# Patient Record
Sex: Female | Born: 1995 | Race: White | Hispanic: Yes | Marital: Married | Smoking: Never smoker
Health system: Southern US, Community
[De-identification: ages and names within clinical notes are randomized; demographics above are authoritative.]

## PROBLEM LIST (undated history)

## (undated) DIAGNOSIS — K5900 Constipation, unspecified: Secondary | ICD-10-CM

## (undated) DIAGNOSIS — O039 Complete or unspecified spontaneous abortion without complication: Secondary | ICD-10-CM

## (undated) DIAGNOSIS — N83209 Unspecified ovarian cyst, unspecified side: Secondary | ICD-10-CM

## (undated) DIAGNOSIS — Z789 Other specified health status: Secondary | ICD-10-CM

## (undated) HISTORY — PX: NO PAST SURGERIES: SHX2092

---
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
## 2013-01-10 ENCOUNTER — Emergency Department (HOSPITAL_COMMUNITY): Payer: Medicaid Other

## 2013-01-10 ENCOUNTER — Inpatient Hospital Stay (HOSPITAL_COMMUNITY)
Admission: EM | Admit: 2013-01-10 | Discharge: 2013-01-12 | DRG: 690 | Disposition: A | Discharge status: Home / Self Care | Payer: Medicaid Other | Attending: Family Medicine | Admitting: Family Medicine

## 2013-01-10 ENCOUNTER — Encounter (HOSPITAL_COMMUNITY): Payer: Self-pay | Admitting: *Deleted

## 2013-01-10 DIAGNOSIS — D72829 Elevated white blood cell count, unspecified: Secondary | ICD-10-CM

## 2013-01-10 DIAGNOSIS — I951 Orthostatic hypotension: Secondary | ICD-10-CM | POA: Diagnosis present

## 2013-01-10 DIAGNOSIS — N2889 Other specified disorders of kidney and ureter: Secondary | ICD-10-CM

## 2013-01-10 DIAGNOSIS — Z23 Encounter for immunization: Secondary | ICD-10-CM

## 2013-01-10 DIAGNOSIS — A498 Other bacterial infections of unspecified site: Secondary | ICD-10-CM | POA: Diagnosis present

## 2013-01-10 DIAGNOSIS — E876 Hypokalemia: Secondary | ICD-10-CM

## 2013-01-10 DIAGNOSIS — N12 Tubulo-interstitial nephritis, not specified as acute or chronic: Principal | ICD-10-CM | POA: Diagnosis present

## 2013-01-10 LAB — URINALYSIS, ROUTINE W REFLEX MICROSCOPIC
Bilirubin Urine: NEGATIVE
Glucose, UA: NEGATIVE mg/dL
Specific Gravity, Urine: 1.012 (ref 1.005–1.030)
Urobilinogen, UA: 0.2 mg/dL (ref 0.0–1.0)

## 2013-01-10 LAB — CBC WITH DIFFERENTIAL/PLATELET
Eosinophils Absolute: 0.1 10*3/uL (ref 0.0–0.7)
Eosinophils Relative: 0 % (ref 0–5)
HCT: 43.6 % (ref 36.0–46.0)
Hemoglobin: 15.3 g/dL — ABNORMAL HIGH (ref 12.0–15.0)
Lymphocytes Relative: 16 % (ref 12–46)
Lymphs Abs: 3.1 10*3/uL (ref 0.7–4.0)
MCH: 29.8 pg (ref 26.0–34.0)
MCV: 85 fL (ref 78.0–100.0)
Monocytes Absolute: 1.2 10*3/uL — ABNORMAL HIGH (ref 0.1–1.0)
Monocytes Relative: 6 % (ref 3–12)
RBC: 5.13 MIL/uL — ABNORMAL HIGH (ref 3.87–5.11)
WBC: 19.6 10*3/uL — ABNORMAL HIGH (ref 4.0–10.5)

## 2013-01-10 LAB — WET PREP, GENITAL
Clue Cells Wet Prep HPF POC: NONE SEEN
Trich, Wet Prep: NONE SEEN

## 2013-01-10 LAB — COMPREHENSIVE METABOLIC PANEL
ALT: 15 U/L (ref 0–35)
BUN: 7 mg/dL (ref 6–23)
CO2: 21 mEq/L (ref 19–32)
Calcium: 9.8 mg/dL (ref 8.4–10.5)
GFR calc Af Amer: >90 mL/min (ref >90)
GFR calc non Af Amer: >90 mL/min (ref >90)
Glucose, Bld: 106 mg/dL — ABNORMAL HIGH (ref 70–99)
Total Protein: 9 g/dL — ABNORMAL HIGH (ref 6.0–8.3)

## 2013-01-10 LAB — URINE MICROSCOPIC-ADD ON

## 2013-01-10 LAB — LIPASE, BLOOD: Lipase: 19 U/L (ref 11–59)

## 2013-01-10 LAB — POCT PREGNANCY, URINE: Preg Test, Ur: NEGATIVE

## 2013-01-10 MED ORDER — IOHEXOL 300 MG/ML  SOLN: 100.0000 mL | Freq: Once | INTRAMUSCULAR | Status: AC | PRN

## 2013-01-10 MED ORDER — ONDANSETRON HCL 4 MG/2ML IJ SOLN
4.0000 mg | Freq: Once | INTRAMUSCULAR | Status: AC
  Administered 2013-01-10: 4 mg via INTRAVENOUS
  Filled 2013-01-10: qty 2

## 2013-01-10 MED ORDER — MORPHINE SULFATE 4 MG/ML IJ SOLN
4.0000 mg | Freq: Once | INTRAMUSCULAR | Status: AC
  Administered 2013-01-10: 4 mg via INTRAVENOUS
  Filled 2013-01-10: qty 1

## 2013-01-10 MED ORDER — SODIUM CHLORIDE 0.9 % IV BOLUS (SEPSIS)
1000.0000 mL | Freq: Once | INTRAVENOUS | Status: AC
  Administered 2013-01-10: 1000 mL via INTRAVENOUS

## 2013-01-10 MED ORDER — IOHEXOL 300 MG/ML  SOLN
80.0000 mL | Freq: Once | INTRAMUSCULAR | Status: AC | PRN
  Administered 2013-01-10: 80 mL via INTRAVENOUS

## 2013-01-10 MED ORDER — DEXTROSE 5 % IV SOLN
1.0000 g | Freq: Once | INTRAVENOUS | Status: AC
  Administered 2013-01-10: 1 g via INTRAVENOUS
  Filled 2013-01-10: qty 10

## 2013-01-10 NOTE — ED Provider Notes (Signed)
History     CSN: [id number]  Arrival date & time 01/10/13  2043   First MD Initiated Contact with Patient 01/10/13 2109      Chief Complaint  Patient presents with  . Abdominal Pain    (Consider location/radiation/quality/duration/timing/severity/associated sxs/prior treatment) HPI Pt p/w lower abd pain x 3 days worsening today. Associated with chills, dysuria and R flank pain. No vaginal d/c or bleeding. No prev abd surgeries.  Past Medical History  Diagnosis Date  . No pertinent past medical history     Past Surgical History  Procedure Date  . No past surgeries     History reviewed. No pertinent family history.  History  Substance Use Topics  . Smoking status: Never Smoker   . Smokeless tobacco: Not on file  . Alcohol Use: No    OB History    Grav Para Term Preterm Abortions TAB SAB Ect Mult Living   2 1 1       1       Review of Systems  Constitutional: Positive for chills and fatigue. Negative for fever.  Respiratory: Negative for shortness of breath.   Cardiovascular: Negative for chest pain.  Gastrointestinal: Positive for abdominal pain. Negative for nausea, vomiting, diarrhea and constipation.  Genitourinary: Positive for dysuria, flank pain and pelvic pain. Negative for vaginal bleeding and vaginal discharge.  Musculoskeletal: Positive for back pain.  Skin: Negative for rash and wound.  Neurological: Positive for dizziness and light-headedness. Negative for weakness, numbness and headaches.  All other systems reviewed and are negative.    Allergies  Review of patient's allergies indicates no known allergies.  Home Medications  No current outpatient prescriptions on file.  BP 108/58  Pulse 90  Temp 98.4 F (36.9 C) (Oral)  Resp 18  Ht 4\' 11"  (1.499 m)  Wt 123 lb (55.792 kg)  BMI 24.84 kg/m2  SpO2 99%  LMP 12/09/2012  Breastfeeding? Unknown  Physical Exam  Nursing note and vitals reviewed. Constitutional: She is oriented to person,  place, and time. She appears well-developed and well-nourished. She appears distressed.       Pt appears uncomfortable  HENT:  Head: Normocephalic and atraumatic.  Mouth/Throat: Oropharynx is clear and moist.  Eyes: EOM are normal. Pupils are equal, round, and reactive to light.  Neck: Normal range of motion. Neck supple.  Cardiovascular: Normal rate and regular rhythm.   Pulmonary/Chest: Effort normal and breath sounds normal. No respiratory distress. She has no wheezes. She has no rales.  Abdominal: Soft. Bowel sounds are normal. She exhibits no distension and no mass. There is tenderness (TTP diffuse lower abd. No rebound or guarding). There is no rebound and no guarding.  Genitourinary: No vaginal discharge found.       +suprapubic TTP, No def CMT   Musculoskeletal: Normal range of motion. She exhibits no edema and no tenderness.       R CVAT.   Neurological: She is alert and oriented to person, place, and time.  Skin: Skin is warm and dry. No rash noted. No erythema.  Psychiatric: She has a normal mood and affect. Her behavior is normal.    ED Course  Procedures (including critical care time)  Labs Reviewed  URINALYSIS, ROUTINE W REFLEX MICROSCOPIC - Abnormal; Notable for the following:    APPearance CLOUDY (*)     Hgb urine dipstick MODERATE (*)     Protein, ur 30 (*)     Nitrite POSITIVE (*)     Leukocytes, UA MODERATE (*)  All other components within normal limits  CBC WITH DIFFERENTIAL - Abnormal; Notable for the following:    WBC 19.6 (*)     RBC 5.13 (*)     Hemoglobin 15.3 (*)     Platelets 402 (*)     Neutrophils Relative 78 (*)     Neutro Abs 15.3 (*)     Monocytes Absolute 1.2 (*)     All other components within normal limits  COMPREHENSIVE METABOLIC PANEL - Abnormal; Notable for the following:    Potassium 3.3 (*)     Glucose, Bld 106 (*)     Total Protein 9.0 (*)     Total Bilirubin 0.2 (*)     All other components within normal limits  WET PREP,  GENITAL - Abnormal; Notable for the following:    WBC, Wet Prep HPF POC FEW (*)     All other components within normal limits  URINE MICROSCOPIC-ADD ON - Abnormal; Notable for the following:    Bacteria, UA MANY (*)     All other components within normal limits  BASIC METABOLIC PANEL - Abnormal; Notable for the following:    Glucose, Bld 144 (*)     BUN 5 (*)     Calcium 8.2 (*)     All other components within normal limits  CBC - Abnormal; Notable for the following:    WBC 14.8 (*)     Hemoglobin 11.7 (*)     HCT 34.8 (*)     All other components within normal limits  LIPASE, BLOOD  GC/CHLAMYDIA PROBE AMP  POCT PREGNANCY, URINE  LACTIC ACID, PLASMA  HIV ANTIBODY (ROUTINE TESTING)  URINE CULTURE  CULTURE, BLOOD (ROUTINE X 2)  CULTURE, BLOOD (ROUTINE X 2)   Ct Abdomen Pelvis W Contrast  01/10/2013  *RADIOLOGY REPORT*  Clinical Data: Right lower quadrant and right flank pain. Difficulty urinating, with dysuria.  CT ABDOMEN AND PELVIS WITH CONTRAST  Technique:  Multidetector CT imaging of the abdomen and pelvis was performed following the standard protocol during bolus administration of intravenous contrast.  Contrast: 80mL OMNIPAQUE IOHEXOL 300 MG/ML  SOLN  Comparison: Prior ultrasound of pregnancy performed 09/07/2011  Findings: The visualized lung bases are clear.  The liver and spleen are unremarkable in appearance.  The gallbladder is within normal limits.  The pancreas and adrenal glands are unremarkable.  There is unusual distension of the right renal pelvis with increased attenuation, and there is diffuse distension of the right ureter, with wall enhancement and increased intraluminal attenuation.  This likely reflects right-sided ureteritis, with complex fluid or possibly blood extending along the right ureter.  The right kidney remains grossly unremarkable.  There is no evidence of hydronephrosis.  The left kidney is unremarkable in appearance.  No renal or ureteral stones are  identified.  No free fluid is identified.  The small bowel is unremarkable in appearance.  The stomach is within normal limits.  No acute vascular abnormalities are seen.  The appendix is normal in caliber, without evidence for appendicitis.  The colon is unremarkable in appearance.  The bladder is mildly distended; there is slightly prominent bladder wall enhancement.  This could reflect cystitis.  The uterus is grossly unremarkable in appearance.  The ovaries are grossly symmetric, with a small right adnexal cyst, likely physiologic in nature.  No suspicious adnexal masses are seen.  No inguinal lymphadenopathy is seen.  No acute osseous abnormalities are identified.  IMPRESSION:  1.  Unusually increased attenuation noted within the  right renal pelvis, with diffuse distension of the right ureter, also demonstrating increased intraluminal attenuation and wall enhancement.  This likely reflects significant right-sided ureteritis, with complex fluid or possibly blood extending along the right ureter.  2.  No definite evidence of pyelonephritis on CT. 3.  Slightly prominent bladder wall enhancement could reflect cystitis.   Original Report Authenticated By: Tonia Ghent, M.D.      1. Ureteritis   2. Hypokalemia   3. Leukocytosis       MDM          Loren Racer, MD 01/11/13 [phone]

## 2013-01-10 NOTE — ED Notes (Signed)
PT to ER via private car for complaints of lower abdominal pain; pt minimal English speaking; reports painful and difficulty urinating; severe lower abd pain; denies vaginal bleeding or vaginal discharge.

## 2013-01-10 NOTE — ED Notes (Signed)
Pelvic cart to bedside 

## 2013-01-10 NOTE — ED Provider Notes (Signed)
Care assumed from Dr. Ranae Palms. Patient with lower abdominal pain. Reported pelvic exam unremarkable. Concern for pyelonephritis as appendicitis given location of pain. CT scan pending.  Results for orders placed during the hospital encounter of 01/10/13  URINALYSIS, ROUTINE W REFLEX MICROSCOPIC      Component Value Range   Color, Urine YELLOW  YELLOW   APPearance CLOUDY (*) CLEAR   Specific Gravity, Urine 1.012  1.005 - 1.030   pH 7.0  5.0 - 8.0   Glucose, UA NEGATIVE  NEGATIVE mg/dL   Hgb urine dipstick MODERATE (*) NEGATIVE   Bilirubin Urine NEGATIVE  NEGATIVE   Ketones, ur NEGATIVE  NEGATIVE mg/dL   Protein, ur 30 (*) NEGATIVE mg/dL   Urobilinogen, UA 0.2  0.0 - 1.0 mg/dL   Nitrite POSITIVE (*) NEGATIVE   Leukocytes, UA MODERATE (*) NEGATIVE  CBC WITH DIFFERENTIAL      Component Value Range   WBC 19.6 (*) 4.0 - 10.5 K/uL   RBC 5.13 (*) 3.87 - 5.11 MIL/uL   Hemoglobin 15.3 (*) 12.0 - 15.0 g/dL   HCT 16.1  09.6 - 04.5 %   MCV 85.0  78.0 - 100.0 fL   MCH 29.8  26.0 - 34.0 pg   MCHC 35.1  30.0 - 36.0 g/dL   RDW 40.9  81.1 - 91.4 %   Platelets 402 (*) 150 - 400 K/uL   Neutrophils Relative 78 (*) 43 - 77 %   Neutro Abs 15.3 (*) 1.7 - 7.7 K/uL   Lymphocytes Relative 16  12 - 46 %   Lymphs Abs 3.1  0.7 - 4.0 K/uL   Monocytes Relative 6  3 - 12 %   Monocytes Absolute 1.2 (*) 0.1 - 1.0 K/uL   Eosinophils Relative 0  0 - 5 %   Eosinophils Absolute 0.1  0.0 - 0.7 K/uL   Basophils Relative 0  0 - 1 %   Basophils Absolute 0.0  0.0 - 0.1 K/uL  COMPREHENSIVE METABOLIC PANEL      Component Value Range   Sodium 137  135 - 145 mEq/L   Potassium 3.3 (*) 3.5 - 5.1 mEq/L   Chloride 99  96 - 112 mEq/L   CO2 21  19 - 32 mEq/L   Glucose, Bld 106 (*) 70 - 99 mg/dL   BUN 7  6 - 23 mg/dL   Creatinine, Ser 7.82  0.50 - 1.10 mg/dL   Calcium 9.8  8.4 - 95.6 mg/dL   Total Protein 9.0 (*) 6.0 - 8.3 g/dL   Albumin 4.1  3.5 - 5.2 g/dL   AST 14  0 - 37 U/L   ALT 15  0 - 35 U/L   Alkaline  Phosphatase 108  39 - 117 U/L   Total Bilirubin 0.2 (*) 0.3 - 1.2 mg/dL   GFR calc non Af Amer >90  >90 mL/min   GFR calc Af Amer >90  >90 mL/min  LIPASE, BLOOD      Component Value Range   Lipase 19  11 - 59 U/L  WET PREP, GENITAL      Component Value Range   Yeast Wet Prep HPF POC NONE SEEN  NONE SEEN   Trich, Wet Prep NONE SEEN  NONE SEEN   Clue Cells Wet Prep HPF POC NONE SEEN  NONE SEEN   WBC, Wet Prep HPF POC FEW (*) NONE SEEN  POCT PREGNANCY, URINE      Component Value Range   Preg Test, Ur NEGATIVE  NEGATIVE  URINE MICROSCOPIC-ADD ON      Component Value Range   Squamous Epithelial / LPF RARE  RARE   WBC, UA 21-50  <3 WBC/hpf   RBC / HPF 3-6  <3 RBC/hpf   Bacteria, UA MANY (*) RARE   Ct Abdomen Pelvis W Contrast  01/10/2013  *RADIOLOGY REPORT*  Clinical Data: Right lower quadrant and right flank pain. Difficulty urinating, with dysuria.  CT ABDOMEN AND PELVIS WITH CONTRAST  Technique:  Multidetector CT imaging of the abdomen and pelvis was performed following the standard protocol during bolus administration of intravenous contrast.  Contrast: 80mL OMNIPAQUE IOHEXOL 300 MG/ML  SOLN  Comparison: Prior ultrasound of pregnancy performed 09/07/2011  Findings: The visualized lung bases are clear.  The liver and spleen are unremarkable in appearance.  The gallbladder is within normal limits.  The pancreas and adrenal glands are unremarkable.  There is unusual distension of the right renal pelvis with increased attenuation, and there is diffuse distension of the right ureter, with wall enhancement and increased intraluminal attenuation.  This likely reflects right-sided ureteritis, with complex fluid or possibly blood extending along the right ureter.  The right kidney remains grossly unremarkable.  There is no evidence of hydronephrosis.  The left kidney is unremarkable in appearance.  No renal or ureteral stones are identified.  No free fluid is identified.  The small bowel is unremarkable  in appearance.  The stomach is within normal limits.  No acute vascular abnormalities are seen.  The appendix is normal in caliber, without evidence for appendicitis.  The colon is unremarkable in appearance.  The bladder is mildly distended; there is slightly prominent bladder wall enhancement.  This could reflect cystitis.  The uterus is grossly unremarkable in appearance.  The ovaries are grossly symmetric, with a small right adnexal cyst, likely physiologic in nature.  No suspicious adnexal masses are seen.  No inguinal lymphadenopathy is seen.  No acute osseous abnormalities are identified.  IMPRESSION:  1.  Unusually increased attenuation noted within the right renal pelvis, with diffuse distension of the right ureter, also demonstrating increased intraluminal attenuation and wall enhancement.  This likely reflects significant right-sided ureteritis, with complex fluid or possibly blood extending along the right ureter.  2.  No definite evidence of pyelonephritis on CT. 3.  Slightly prominent bladder wall enhancement could reflect cystitis.   Original Report Authenticated By: Tonia Ghent, M.D.       Olivia Mackie, MD 01/11/13 [phone]

## 2013-01-10 NOTE — ED Notes (Signed)
Pt unable to ambulate to bathroom w/o assistance. Pt c/o severe pain to lower abd.

## 2013-01-11 ENCOUNTER — Encounter (HOSPITAL_COMMUNITY): Payer: Self-pay | Admitting: Internal Medicine

## 2013-01-11 DIAGNOSIS — D72829 Elevated white blood cell count, unspecified: Secondary | ICD-10-CM

## 2013-01-11 DIAGNOSIS — E876 Hypokalemia: Secondary | ICD-10-CM

## 2013-01-11 DIAGNOSIS — N2889 Other specified disorders of kidney and ureter: Secondary | ICD-10-CM

## 2013-01-11 LAB — BASIC METABOLIC PANEL
BUN: 5 mg/dL — ABNORMAL LOW (ref 6–23)
Chloride: 103 mEq/L (ref 96–112)
Creatinine, Ser: 0.58 mg/dL (ref 0.50–1.10)
GFR calc Af Amer: >90 mL/min (ref >90)
Glucose, Bld: 144 mg/dL — ABNORMAL HIGH (ref 70–99)

## 2013-01-11 LAB — CBC
HCT: 34.8 % — ABNORMAL LOW (ref 36.0–46.0)
Hemoglobin: 11.7 g/dL — ABNORMAL LOW (ref 12.0–15.0)
MCH: 29 pg (ref 26.0–34.0)
MCHC: 33.6 g/dL (ref 30.0–36.0)
MCV: 86.4 fL (ref 78.0–100.0)
RDW: 12.9 % (ref 11.5–15.5)

## 2013-01-11 LAB — LACTIC ACID, PLASMA: Lactic Acid, Venous: 1.8 mmol/L (ref 0.5–2.2)

## 2013-01-11 LAB — GC/CHLAMYDIA PROBE AMP: GC Probe RNA: NEGATIVE

## 2013-01-11 MED ORDER — ACETAMINOPHEN 650 MG RE SUPP: 650.0000 mg | Freq: Four times a day (QID) | RECTAL | Status: DC | PRN

## 2013-01-11 MED ORDER — SODIUM CHLORIDE 0.9 % IJ SOLN: 3.0000 mL | Freq: Two times a day (BID) | INTRAMUSCULAR | Status: DC

## 2013-01-11 MED ORDER — POTASSIUM CHLORIDE CRYS ER 20 MEQ PO TBCR
40.0000 meq | EXTENDED_RELEASE_TABLET | Freq: Once | ORAL | Status: AC
  Administered 2013-01-11: 40 meq via ORAL
  Filled 2013-01-11: qty 2

## 2013-01-11 MED ORDER — HYDROCODONE-ACETAMINOPHEN 5-325 MG PO TABS
1.0000 | ORAL_TABLET | ORAL | Status: DC | PRN
  Administered 2013-01-11 (×3): 1 via ORAL
  Administered 2013-01-12: 2 via ORAL
  Filled 2013-01-11 (×5): qty 1

## 2013-01-11 MED ORDER — ACETAMINOPHEN 325 MG PO TABS: 650.0000 mg | ORAL_TABLET | Freq: Four times a day (QID) | ORAL | Status: DC | PRN

## 2013-01-11 MED ORDER — INFLUENZA VIRUS VACC SPLIT PF IM SUSP
0.5000 mL | INTRAMUSCULAR | Status: DC
  Filled 2013-01-11 (×3): qty 0.5

## 2013-01-11 MED ORDER — ONDANSETRON HCL 4 MG PO TABS: 4.0000 mg | ORAL_TABLET | Freq: Four times a day (QID) | ORAL | Status: DC | PRN

## 2013-01-11 MED ORDER — ONDANSETRON HCL 4 MG/2ML IJ SOLN
4.0000 mg | Freq: Three times a day (TID) | INTRAMUSCULAR | Status: DC | PRN
  Filled 2013-01-11: qty 2

## 2013-01-11 MED ORDER — IOHEXOL 300 MG/ML  SOLN: 50.0000 mL | Freq: Once | INTRAMUSCULAR | Status: AC | PRN

## 2013-01-11 MED ORDER — HYDROMORPHONE HCL PF 1 MG/ML IJ SOLN
1.0000 mg | INTRAMUSCULAR | Status: DC | PRN
  Filled 2013-01-11: qty 1

## 2013-01-11 MED ORDER — HYDROMORPHONE HCL PF 1 MG/ML IJ SOLN
1.0000 mg | INTRAMUSCULAR | Status: DC | PRN
  Administered 2013-01-11: 1 mg via INTRAVENOUS

## 2013-01-11 MED ORDER — SODIUM CHLORIDE 0.9 % IV SOLN
INTRAVENOUS | Status: DC
  Administered 2013-01-11 – 2013-01-12 (×4): via INTRAVENOUS

## 2013-01-11 MED ORDER — ONDANSETRON HCL 4 MG/2ML IJ SOLN
4.0000 mg | Freq: Four times a day (QID) | INTRAMUSCULAR | Status: DC | PRN
  Administered 2013-01-11: 4 mg via INTRAVENOUS

## 2013-01-11 MED ORDER — DOCUSATE SODIUM 100 MG PO CAPS
100.0000 mg | ORAL_CAPSULE | Freq: Two times a day (BID) | ORAL | Status: DC
  Administered 2013-01-11 – 2013-01-12 (×4): 100 mg via ORAL
  Filled 2013-01-11 (×5): qty 1

## 2013-01-11 MED ORDER — CEFTRIAXONE SODIUM 1 G IJ SOLR
1.0000 g | INTRAMUSCULAR | Status: DC
  Administered 2013-01-12: 1 g via INTRAVENOUS
  Filled 2013-01-11: qty 10

## 2013-01-11 NOTE — H&P (Signed)
[facility] History and Physical  Kristen Weaver ZOX:[id number] DOB: 29-Oct-1996 DOA: 01/10/2013  Referring physician: Dr Norlene Campbell. PCP: No primary provider on file.    Chief Complaint: abdominal pain.   HPI: Kristen Weaver is a 17 y.o. female with no PMH presents to ED complaining of abdominal pain that started 4 days prior to admission. Patient relates suprapubic pain, dysuria, initially unable to urinate due to severe pain. Patient has been getting progressively worse. Today she was not even able to ambulate due to pain. She also relates dizziness on standing. She almost pass out. She relates chills, nausea. No vomiting. Denies hematuria.   Review of Systems: The patient denies anorexia, , weight loss,, vision loss, decreased hearing, hoarseness, chest pain,  dyspnea on exertion, peripheral edema, balance deficits, hemoptysis, melena, hematochezia,  hematuria,  genital sores, muscle weakness, suspicious skin lesions, transient blindness, difficulty walking, depression, unusual weight change, abnormal bleeding, enlarged lymph nodes, angioedema, and breast masses.    Past Medical History  Diagnosis Date  . No pertinent past medical history    Past Surgical History  Procedure Date  . No past surgeries    Social History:  reports that she has never smoked. She does not have any smokeless tobacco history on file. She reports that she does not drink alcohol or use illicit drugs.Live with husband, has 82 year old son. She is not working. Does not have a PCP.    No Known Allergies  Family History: Mother Healthy. Father healthy.   Prior to Admission medications   Not on File  She is not taking any medications.   Physical Exam: Filed Vitals:   01/11/13 0000 01/11/13 0008 01/11/13 0040 01/11/13 0117  BP:   100/47 106/58  Pulse: 113  101   Temp:  99.4 F (37.4 C)    TempSrc:  Oral    Resp: 22  18 18   SpO2: 100%  97%    General Appearance:    Alert,  cooperative, no distress, appears stated age  Head:    Normocephalic, without obvious abnormality, atraumatic  Eyes:    PERRL, conjunctiva/corneas clear, EOM's intact,      Ears:    Normal TM's and external ear canals, both ears  Nose:   Nares normal, septum midline, mucosa normal, no drainage    or sinus tenderness  Throat:   Lips, mucosa, and tongue normal; teeth and gums normal  Neck:   Supple, symmetrical, trachea midline, no adenopathy;    thyroid:  no enlargement/tenderness/nodules; no carotid   bruit or JVD  Back:     Symmetric, no curvature, ROM normal, no CVA tenderness  Lungs:     Clear to auscultation bilaterally, respirations unlabored      Heart:    Regular rate and rhythm, S1 and S2 normal, no murmur, rub   or gallop     Abdomen:     Soft, supra pubic tender, bowel sounds active all four quadrants,    no masses, no organomegaly        Extremities:   Extremities normal, atraumatic, no cyanosis or edema  Pulses:   2+ and symmetric all extremities  Skin:   Skin color, texture, turgor normal, no rashes or lesions  Lymph nodes:   Cervical, supraclavicular, and axillary nodes normal  Neurologic:   CNII-XII intact, normal strength, sensation and reflexes    throughout      Labs on Admission:  Basic Metabolic Panel:  Lab 01/10/13 8119  NA 137  K  3.3*  CL 99  CO2 21  GLUCOSE 106*  BUN 7  CREATININE 0.63  CALCIUM 9.8  MG --  PHOS --   Liver Function Tests:  Lab 01/10/13 2100  AST 14  ALT 15  ALKPHOS 108  BILITOT 0.2*  PROT 9.0*  ALBUMIN 4.1    Lab 01/10/13 2100  LIPASE 19  AMYLASE --   No results found for this basename: AMMONIA:5 in the last 168 hours CBC:  Lab 01/10/13 2100  WBC 19.6*  NEUTROABS 15.3*  HGB 15.3*  HCT 43.6  MCV 85.0  PLT 402*   Cardiac Enzymes: No results found for this basename: CKTOTAL:5,CKMB:5,CKMBINDEX:5,TROPONINI:5 in the last 168 hours  BNP (last 3 results) No results found for this basename: PROBNP:3 in the last  8760 hours CBG: No results found for this basename: GLUCAP:5 in the last 168 hours  Radiological Exams on Admission: Ct Abdomen Pelvis W Contrast  01/10/2013  *RADIOLOGY REPORT*  Clinical Data: Right lower quadrant and right flank pain. Difficulty urinating, with dysuria.  CT ABDOMEN AND PELVIS WITH CONTRAST  Technique:  Multidetector CT imaging of the abdomen and pelvis was performed following the standard protocol during bolus administration of intravenous contrast.  Contrast: 80mL OMNIPAQUE IOHEXOL 300 MG/ML  SOLN  Comparison: Prior ultrasound of pregnancy performed 09/07/2011  Findings: The visualized lung bases are clear.  The liver and spleen are unremarkable in appearance.  The gallbladder is within normal limits.  The pancreas and adrenal glands are unremarkable.  There is unusual distension of the right renal pelvis with increased attenuation, and there is diffuse distension of the right ureter, with wall enhancement and increased intraluminal attenuation.  This likely reflects right-sided ureteritis, with complex fluid or possibly blood extending along the right ureter.  The right kidney remains grossly unremarkable.  There is no evidence of hydronephrosis.  The left kidney is unremarkable in appearance.  No renal or ureteral stones are identified.  No free fluid is identified.  The small bowel is unremarkable in appearance.  The stomach is within normal limits.  No acute vascular abnormalities are seen.  The appendix is normal in caliber, without evidence for appendicitis.  The colon is unremarkable in appearance.  The bladder is mildly distended; there is slightly prominent bladder wall enhancement.  This could reflect cystitis.  The uterus is grossly unremarkable in appearance.  The ovaries are grossly symmetric, with a small right adnexal cyst, likely physiologic in nature.  No suspicious adnexal masses are seen.  No inguinal lymphadenopathy is seen.  No acute osseous abnormalities are  identified.  IMPRESSION:  1.  Unusually increased attenuation noted within the right renal pelvis, with diffuse distension of the right ureter, also demonstrating increased intraluminal attenuation and wall enhancement.  This likely reflects significant right-sided ureteritis, with complex fluid or possibly blood extending along the right ureter.  2.  No definite evidence of pyelonephritis on CT. 3.  Slightly prominent bladder wall enhancement could reflect cystitis.   Original Report Authenticated By: Tonia Ghent, M.D.       Assessment/Plan Active Problems:  Ureteritis  Leukocytosis  Hypokalemia   1-Ureteritis/Pyelonephritis: Patient will be admitted for treatment of pyelo, ureteritis. Will continue with IV antibiotic, ceftriaxone. She has significant leukocytosis. Lactic acid pending work up for sepsis. BP stable at this time. IV fluids. Will order blood culture. Urine culture and GC/chlamidia was ordered. Dr Norlene Campbell discussed CT scan scan result with urologist on call, who recommend to treat like pyelonephritis.  2-Leukocytosis: Likely secondary to  UTI. Blood culture. IV antibiotics.  3-Screen HIV.  4-Hypokalemia: replete with 40 meq times 1.  5-Dizziness: likely orthostatic. Continue with IV fluids. Received 2 L in the ED.   Code Status: Full Code.  Family Communication: Husband at bedside plan of care discussed with him.  Disposition Plan: expect 3 days depending on condition.   Time spent: 50 minutes.   Kristen Weaver [facility] Pager [phone]  If 7PM-7AM, please contact night-coverage [email or web address] Password [email or web address] 01/11/2013, 1:26 AM

## 2013-01-11 NOTE — Progress Notes (Signed)
Patient's current VTE is for SCDs. At this time, there are not any current SCD's available within the system. [facility] is aware that patient needs them when they become available.

## 2013-01-11 NOTE — ED Notes (Signed)
Hospitalist at bedside 

## 2013-01-11 NOTE — ED Notes (Signed)
Attempted to give report to [place], [place]. Currently busy.

## 2013-01-11 NOTE — ED Notes (Signed)
Phlebotomy at bedside.

## 2013-01-11 NOTE — Progress Notes (Signed)
Patient seen and evaluated earlier this AM by my associate.  Please refer to her H and P for details regarding her assessment and Plan.  Ureteritis - Urine culture pending - continue IV rocephin - monitor WBC levels and fever curve - Based on orders urology consulted as well - Wet prep negative for yeast, trich, or clue cells - GC/Chlamydia pending  Leukocytosis - likely 2ary to # 1 - trending down on IV antibiotics - continue to monitor - Blood cultures pending  Hypokalemia - resolved after oral replacement  Will f/u next am  Torrin Crihfield, Pamala Hurry

## 2013-01-12 LAB — URINE CULTURE

## 2013-01-12 LAB — CBC
HCT: 37.1 % (ref 36.0–46.0)
MCH: 28.7 pg (ref 26.0–34.0)
MCHC: 32.9 g/dL (ref 30.0–36.0)
MCV: 87.3 fL (ref 78.0–100.0)
Platelets: 323 10*3/uL (ref 150–400)
RDW: 13.2 % (ref 11.5–15.5)
WBC: 9.2 10*3/uL (ref 4.0–10.5)

## 2013-01-12 MED ORDER — CIPROFLOXACIN HCL 500 MG PO TABS: 500.0000 mg | ORAL_TABLET | Freq: Two times a day (BID) | ORAL | Status: DC

## 2013-01-12 MED ORDER — ONDANSETRON 4 MG PO TBDP: 4.0000 mg | ORAL_TABLET | Freq: Three times a day (TID) | ORAL | Status: DC | PRN

## 2013-01-12 MED ORDER — HYDROCODONE-ACETAMINOPHEN 5-325 MG PO TABS: 1.0000 | ORAL_TABLET | ORAL | Status: DC | PRN

## 2013-01-12 NOTE — Discharge Summary (Signed)
Physician Discharge Summary  Kristen Weaver:[id number] DOB: 03-Oct-1996 DOA: 01/10/2013  PCP: No primary provider on file.  Admit date: 01/10/2013 Discharge date: 01/12/2013  Time spent: > 35 minutes  Recommendations for Outpatient Follow-up:  1. Please be sure to follow up with your primary care physician.  Also when evaluating patient in follow up please decide whether or not patient will require extension of antibiotic regimen for her UTI/Uretitis.  2. I have recommended that patient follow up with her primary care physician for appropriate referrals for her reported gynecological problems as well as uretitis.  Discharge Diagnoses:  Active Problems:  Ureteritis  Leukocytosis  Hypokalemia   Discharge Condition: stable  Diet recommendation: Regular diet  Filed Weights   01/11/13 0351  Weight: 55.792 kg (123 lb)    History of present illness:  From original HPI: Kristen Weaver is a 17 y.o. female with no PMH presents to ED complaining of abdominal pain that started 4 days prior to admission. Patient relates suprapubic pain, dysuria, initially unable to urinate due to severe pain. Patient has been getting progressively worse. Today she was not even able to ambulate due to pain. She also relates dizziness on standing. She almost pass out. She relates chills, nausea. No vomiting. Denies hematuria.    Hospital Course:  Ureteritis  - Most likely due to E coli infection and would expect resolution with antibiotics.  Patient to follow up with her PCP for further evaluation and recommendations. - improved initially with IV rocephin  - WBC within normal limits on day of discharge. - Wet prep negative for yeast, trich, or clue cells  - GC/Chlamydia negative  Leukocytosis  - likely 2ary to # 1  - trended down on IV antibiotics.  Sensitivities back which grew out E coli which was sensitive to everything except bactrim - Discussed antibiotic options with case  manager and ciprofloxacin should only cost patient 4 dollars in [facility]. - within normal limits on day of discharge. - Blood cultures shows no growth to date.   Hypokalemia  - resolved after oral replacement  Procedures:  None  Consultations:  none  Discharge Exam: Filed Vitals:   01/11/13 0638 01/11/13 1457 01/11/13 2154 01/12/13 0527  BP: 94/53 108/58 104/58 108/56  Pulse: 86 90 81 79  Temp: 98 F (36.7 C) 98.4 F (36.9 C) 98.8 F (37.1 C) 98.1 F (36.7 C)  TempSrc: Oral Oral Oral Oral  Resp: 16 18 18 18   Height:      Weight:      SpO2: 99% 99% 100% 100%    General: Pt in NAD, Alert and Oriented x 3 Cardiovascular: RRR, No MRG Respiratory: CTA BL, no wheezes Abdomen: soft, nondistended, suprapubic discomfort  Discharge Instructions  Discharge Orders    Future Orders Please Complete By Expires   Diet - low sodium heart healthy      Increase activity slowly      Discharge instructions      Comments:   Me gustaria que terminaras todas las pastillas de el antibiotico recommendado.  Seria por 10 dias q te recomiendo que tomes ciprofloxacin.  Se toma dos veces al dia por los proximos 10 dias.  Tambien te recete medicamento para la nausea toma los como indicado.  Si tienes preguntas [employer] recetados por favor de preguntarle al pharmaceutico.  Please be sure to take your medication as indicated and finish all of the antibiotics prescribed.   Call MD for:  temperature >100.4  Call MD for:  severe uncontrolled pain      Call MD for:  persistant nausea and vomiting      Call MD for:  extreme fatigue          Medication List     As of 01/12/2013  2:22 PM    TAKE these medications         ciprofloxacin 500 MG tablet   Commonly known as: CIPRO   Take 1 tablet (500 mg total) by mouth 2 (two) times daily.      HYDROcodone-acetaminophen 5-325 MG per tablet   Commonly known as: NORCO/VICODIN   Take 1 tablet by mouth every 4 (four) hours as needed  for pain.      ondansetron 4 MG disintegrating tablet   Commonly known as: ZOFRAN-ODT   Take 1 tablet (4 mg total) by mouth every 8 (eight) hours as needed for nausea.          The results of significant diagnostics from this hospitalization (including imaging, microbiology, ancillary and laboratory) are listed below for reference.    Significant Diagnostic Studies: Ct Abdomen Pelvis W Contrast  01/10/2013  *RADIOLOGY REPORT*  Clinical Data: Right lower quadrant and right flank pain. Difficulty urinating, with dysuria.  CT ABDOMEN AND PELVIS WITH CONTRAST  Technique:  Multidetector CT imaging of the abdomen and pelvis was performed following the standard protocol during bolus administration of intravenous contrast.  Contrast: 80mL OMNIPAQUE IOHEXOL 300 MG/ML  SOLN  Comparison: Prior ultrasound of pregnancy performed 09/07/2011  Findings: The visualized lung bases are clear.  The liver and spleen are unremarkable in appearance.  The gallbladder is within normal limits.  The pancreas and adrenal glands are unremarkable.  There is unusual distension of the right renal pelvis with increased attenuation, and there is diffuse distension of the right ureter, with wall enhancement and increased intraluminal attenuation.  This likely reflects right-sided ureteritis, with complex fluid or possibly blood extending along the right ureter.  The right kidney remains grossly unremarkable.  There is no evidence of hydronephrosis.  The left kidney is unremarkable in appearance.  No renal or ureteral stones are identified.  No free fluid is identified.  The small bowel is unremarkable in appearance.  The stomach is within normal limits.  No acute vascular abnormalities are seen.  The appendix is normal in caliber, without evidence for appendicitis.  The colon is unremarkable in appearance.  The bladder is mildly distended; there is slightly prominent bladder wall enhancement.  This could reflect cystitis.  The uterus  is grossly unremarkable in appearance.  The ovaries are grossly symmetric, with a small right adnexal cyst, likely physiologic in nature.  No suspicious adnexal masses are seen.  No inguinal lymphadenopathy is seen.  No acute osseous abnormalities are identified.  IMPRESSION:  1.  Unusually increased attenuation noted within the right renal pelvis, with diffuse distension of the right ureter, also demonstrating increased intraluminal attenuation and wall enhancement.  This likely reflects significant right-sided ureteritis, with complex fluid or possibly blood extending along the right ureter.  2.  No definite evidence of pyelonephritis on CT. 3.  Slightly prominent bladder wall enhancement could reflect cystitis.   Original Report Authenticated By: Tonia Ghent, M.D.     Microbiology: Recent Results (from the past 240 hour(s))  URINE CULTURE     Status: Normal   Collection Time   01/10/13  9:19 PM      Component Value Range Status Comment   Specimen Description URINE,  CATHETERIZED   Final    Special Requests NONE   Final    Culture  Setup Time 01/11/2013 01:25   Final    Colony Count >=100,000 COLONIES/ML   Final    Culture ESCHERICHIA COLI   Final    Report Status 01/12/2013 FINAL   Final    Organism ID, Bacteria ESCHERICHIA COLI   Final   WET PREP, GENITAL     Status: Abnormal   Collection Time   01/10/13 10:00 PM      Component Value Range Status Comment   Yeast Wet Prep HPF POC NONE SEEN  NONE SEEN Final    Trich, Wet Prep NONE SEEN  NONE SEEN Final    Clue Cells Wet Prep HPF POC NONE SEEN  NONE SEEN Final    WBC, Wet Prep HPF POC FEW (*) NONE SEEN Final   GC/CHLAMYDIA PROBE AMP     Status: Normal   Collection Time   01/10/13 10:00 PM      Component Value Range Status Comment   CT Probe RNA NEGATIVE  NEGATIVE Final    GC Probe RNA NEGATIVE  NEGATIVE Final   CULTURE, BLOOD (ROUTINE X 2)     Status: Normal (Preliminary result)   Collection Time   01/11/13 12:55 AM      Component  Value Range Status Comment   Specimen Description BLOOD RIGHT FOREARM   Final    Special Requests BOTTLES DRAWN AEROBIC AND ANAEROBIC 5CC   Final    Culture  Setup Time 01/11/2013 04:58   Final    Culture     Final    Value:        BLOOD CULTURE RECEIVED NO GROWTH TO DATE CULTURE WILL BE HELD FOR 5 DAYS BEFORE ISSUING A FINAL NEGATIVE REPORT   Report Status PENDING   Incomplete   CULTURE, BLOOD (ROUTINE X 2)     Status: Normal (Preliminary result)   Collection Time   01/11/13  1:00 AM      Component Value Range Status Comment   Specimen Description BLOOD RIGHT HAND   Final    Special Requests BOTTLES DRAWN AEROBIC AND ANAEROBIC 4CC   Final    Culture  Setup Time 01/11/2013 04:58   Final    Culture     Final    Value:        BLOOD CULTURE RECEIVED NO GROWTH TO DATE CULTURE WILL BE HELD FOR 5 DAYS BEFORE ISSUING A FINAL NEGATIVE REPORT   Report Status PENDING   Incomplete      Labs: Basic Metabolic Panel:  Lab 01/11/13 1610 01/10/13 2100  NA 136 137  K 3.7 3.3*  CL 103 99  CO2 21 21  GLUCOSE 144* 106*  BUN 5* 7  CREATININE 0.58 0.63  CALCIUM 8.2* 9.8  MG -- --  PHOS -- --   Liver Function Tests:  Lab 01/10/13 2100  AST 14  ALT 15  ALKPHOS 108  BILITOT 0.2*  PROT 9.0*  ALBUMIN 4.1    Lab 01/10/13 2100  LIPASE 19  AMYLASE --   No results found for this basename: AMMONIA:5 in the last 168 hours CBC:  Lab 01/12/13 0439 01/11/13 0449 01/10/13 2100  WBC 9.2 14.8* 19.6*  NEUTROABS -- -- 15.3*  HGB 12.2 11.7* 15.3*  HCT 37.1 34.8* 43.6  MCV 87.3 86.4 85.0  PLT 323 302 402*   Cardiac Enzymes: No results found for this basename: CKTOTAL:5,CKMB:5,CKMBINDEX:5,TROPONINI:5 in the last 168 hours BNP: BNP (  last 3 results) No results found for this basename: PROBNP:3 in the last 8760 hours CBG: No results found for this basename: GLUCAP:5 in the last 168 hours     Signed:  Penny Pia  [facility] 01/12/2013, 2:22 PM

## 2013-01-17 LAB — CULTURE, BLOOD (ROUTINE X 2): Culture: NO GROWTH

## 2013-01-19 DIAGNOSIS — K5900 Constipation, unspecified: Secondary | ICD-10-CM | POA: Insufficient documentation

## 2013-01-19 DIAGNOSIS — K5641 Fecal impaction: Secondary | ICD-10-CM | POA: Insufficient documentation

## 2013-01-20 ENCOUNTER — Emergency Department (HOSPITAL_COMMUNITY)
Admission: EM | Admit: 2013-01-20 | Discharge: 2013-01-20 | Disposition: A | Discharge status: Home / Self Care | Payer: MEDICAID | Attending: Emergency Medicine | Admitting: Emergency Medicine

## 2013-01-20 ENCOUNTER — Encounter (HOSPITAL_COMMUNITY): Payer: Self-pay | Admitting: Emergency Medicine

## 2013-01-20 DIAGNOSIS — K5641 Fecal impaction: Secondary | ICD-10-CM

## 2013-01-20 MED ORDER — FLEET ENEMA 7-19 GM/118ML RE ENEM
1.0000 | ENEMA | Freq: Once | RECTAL | Status: AC
  Administered 2013-01-20: 1 via RECTAL
  Filled 2013-01-20: qty 1

## 2013-01-20 NOTE — ED Notes (Signed)
Pt alert, spanish speaking, c/o gen abd pain, constipation, last BM yesterday, resp even unlabored, skin pwd

## 2013-01-20 NOTE — ED Notes (Signed)
Pts husband verbalized understanding of d/c instructions, instructions provided in english and spanish. Pt ambulatory to check out window

## 2013-01-20 NOTE — ED Notes (Signed)
Pt up to BR

## 2013-01-20 NOTE — ED Provider Notes (Signed)
History     CSN: [id number]  Arrival date & time 01/19/13  2359   First MD Initiated Contact with Patient 01/20/13 0225      Chief Complaint  Patient presents with  . Abdominal Pain  . Constipation     A language interpreter was used.   Pt was seen at 0245.   Per pt, c/o gradual onset and persistence of constant constipation for the past 2 days.  Pt's last BM 2 days ago was "small and hard."  States she is having rectal pain described as "it feels like stool is stuck there."  Has been associated with lower abd "pain."  Pt describes the abd pain as "it feels like I have to go to the bathroom (poop)."  Denies dysuria/hematuria, no N/V/D, no vaginal bleeding/discharge, no back pain, no fevers, no rectal bleeding.     Past Medical History  Diagnosis Date  . No pertinent past medical history     Past Surgical History  Procedure Date  . No past surgeries      History  Substance Use Topics  . Smoking status: Never Smoker   . Smokeless tobacco: Not on file  . Alcohol Use: No    OB History    Grav Para Term Preterm Abortions TAB SAB Ect Mult Living   2 1 1       1       Review of Systems ROS: Statement: All systems negative except as marked or noted in the HPI; Constitutional: Negative for fever and chills. ; ; Eyes: Negative for eye pain, redness and discharge. ; ; ENMT: Negative for ear pain, hoarseness, nasal congestion, sinus pressure and sore throat. ; ; Cardiovascular: Negative for chest pain, palpitations, diaphoresis, dyspnea and peripheral edema. ; ; Respiratory: Negative for cough, wheezing and stridor. ; ; Gastrointestinal: +constipation, rectal pain.  Negative for nausea, vomiting, diarrhea, abdominal pain, blood in stool, hematemesis, jaundice and rectal bleeding. . ; ; Genitourinary: Negative for dysuria, flank pain and hematuria. ; ; GYN:  No vaginal bleeding, no vaginal discharge, no vulvar pain.;; Musculoskeletal: Negative for back pain and neck pain. Negative for  swelling and trauma.; ; Skin: Negative for pruritus, rash, abrasions, blisters, bruising and skin lesion.; ; Neuro: Negative for headache, lightheadedness and neck stiffness. Negative for weakness, altered level of consciousness , altered mental status, extremity weakness, paresthesias, involuntary movement, seizure and syncope.        Allergies  Review of patient's allergies indicates no known allergies.  Home Medications   Current Outpatient Rx  Name  Route  Sig  Dispense  Refill  . CIPROFLOXACIN HCL 500 MG PO TABS   Oral   Take 1 tablet (500 mg total) by mouth 2 (two) times daily.   20 tablet   0   . HYDROCODONE-ACETAMINOPHEN 5-325 MG PO TABS   Oral   Take 1 tablet by mouth every 4 (four) hours as needed for pain.   30 tablet   0   . ONDANSETRON 4 MG PO TBDP   Oral   Take 1 tablet (4 mg total) by mouth every 8 (eight) hours as needed for nausea.   20 tablet   0     BP 131/87  Pulse 106  Temp 97.6 F (36.4 C) (Oral)  Resp 20  SpO2 99%  LMP 01/20/2013  Physical Exam 0250: Physical examination:  Nursing notes reviewed; Vital signs and O2 SAT reviewed;  Constitutional: Well developed, Well nourished, Well hydrated, Uncomfortable appearing; Head:  Normocephalic,  atraumatic; Eyes: EOMI, PERRL, No scleral icterus; ENMT: Mouth and pharynx normal, Mucous membranes moist; Neck: Supple, Full range of motion, No lymphadenopathy; Cardiovascular: Regular rate and rhythm, No murmur, rub, or gallop; Respiratory: Breath sounds clear & equal bilaterally, No rales, rhonchi, wheezes.  Speaking full sentences with ease, Normal respiratory effort/excursion; Chest: Nontender, Movement normal; Abdomen: Soft, +mild suprapubic tenderness to palp. No rebound or guarding. Nondistended, Normal bowel sounds; Rectal exam performed w/permission of pt and ED RN chaparone present.  Anal tone normal.  Non-tender, soft brown stool in rectal vault, +fecal impaction.  No fissures, no external hemorrhoids, no  palp masses.;;Genitourinary: No CVA tenderness; Extremities: Pulses normal, No tenderness, No edema, No calf edema or asymmetry.; Neuro: AA&Ox3, Major CN grossly intact.  Speech clear. No gross focal motor or sensory deficits in extremities.; Skin: Color normal, Warm, Dry.   ED Course  Procedures     MDM  MDM Reviewed: nursing note, previous chart and vitals   0255:  Pt seen in ED approx 1 week ago for lower abd pain, dx UTI.  States she feels improved "with my urine" and is here today "only for not being able to poop."  +fecal impaction on exam.     0420:  With pt permission, pt disimpacted.  Pt then had 2 large BM's on her own.  Wants to go home now.  Abd benign on re-exam.  Walking around ED with steady upright gait.  Dx d/w pt and family.  Questions answered.  Verb understanding, agreeable to d/c home with outpt f/u.        Laray Anger, DO 01/22/13 2007

## 2013-01-22 ENCOUNTER — Encounter (HOSPITAL_COMMUNITY): Payer: Self-pay

## 2013-01-22 ENCOUNTER — Emergency Department (HOSPITAL_COMMUNITY)
Admission: EM | Admit: 2013-01-22 | Discharge: 2013-01-22 | Disposition: A | Discharge status: Home / Self Care | Payer: Self-pay | Source: Home / Self Care | Attending: Family Medicine | Admitting: Family Medicine

## 2013-01-22 DIAGNOSIS — R7309 Other abnormal glucose: Secondary | ICD-10-CM

## 2013-01-22 DIAGNOSIS — R739 Hyperglycemia, unspecified: Secondary | ICD-10-CM

## 2013-01-22 DIAGNOSIS — K5900 Constipation, unspecified: Secondary | ICD-10-CM

## 2013-01-22 MED ORDER — DOCUSATE SODIUM 100 MG PO CAPS: 100.0000 mg | ORAL_CAPSULE | Freq: Two times a day (BID) | ORAL | Status: DC

## 2013-01-22 MED ORDER — IBUPROFEN 400 MG PO TABS: 400.0000 mg | ORAL_TABLET | Freq: Three times a day (TID) | ORAL | Status: DC | PRN

## 2013-01-22 NOTE — ED Provider Notes (Signed)
History   CSN: [id number]  Arrival date & time 01/22/13  1534   First MD Initiated Contact with Patient 01/22/13 1540     Chief Complaint  Patient presents with  . Follow-up    The history is provided by the patient. The history is limited by a language barrier. A language interpreter was used.  Pt presents today to follow up from recent  ER visit where she was severely constipated and impacted and required.   Pt says that she is feeling better and not having any constipation.  Pt says that she is having pain in her joints.  She says that this has been going on for a few days.  Pt says that she has been having a heavy menstrual period.  She is currently taking oral contraceptives.    Past Medical History  Diagnosis Date  . No pertinent past medical history     Past Surgical History  Procedure Date  . No past surgeries     No family history on file.  History  Substance Use Topics  . Smoking status: Never Smoker   . Smokeless tobacco: Not on file  . Alcohol Use: No    OB History    Grav Para Term Preterm Abortions TAB SAB Ect Mult Living   2 1 1       1      Review of Systems  Constitutional: Positive for fatigue.  HENT: Negative.   Eyes: Negative.   Respiratory: Negative.   Cardiovascular: Negative.   Gastrointestinal: Negative.   Musculoskeletal: Positive for arthralgias.  Neurological: Negative.   Hematological: Negative.   Psychiatric/Behavioral: Negative.   All other systems reviewed and are negative.   Allergies  Review of patient's allergies indicates no known allergies.  Home Medications   Current Outpatient Rx  Name  Route  Sig  Dispense  Refill  . CIPROFLOXACIN HCL 500 MG PO TABS   Oral   Take 1 tablet (500 mg total) by mouth 2 (two) times daily.   20 tablet   0   . HYDROCODONE-ACETAMINOPHEN 5-325 MG PO TABS   Oral   Take 1 tablet by mouth every 4 (four) hours as needed for pain.   30 tablet   0   . ONDANSETRON 4 MG PO TBDP   Oral   Take 1  tablet (4 mg total) by mouth every 8 (eight) hours as needed for nausea.   20 tablet   0    BP 137/74  Pulse 83  Temp 97.8 F (36.6 C) (Oral)  Resp 18  SpO2 98%  LMP 01/20/2013  Physical Exam  Nursing note and vitals reviewed. Constitutional: She is oriented to person, place, and time. She appears well-developed and well-nourished. No distress.  HENT:  Head: Normocephalic and atraumatic.  Right Ear: External ear normal.  Left Ear: External ear normal.  Eyes: Conjunctivae normal and EOM are normal. Pupils are equal, round, and reactive to light.  Neck: Normal range of motion. Neck supple.  Cardiovascular: Normal rate, regular rhythm and normal heart sounds.   Pulmonary/Chest: Effort normal. No respiratory distress.  Abdominal: Soft. Bowel sounds are normal.  Musculoskeletal: Normal range of motion. She exhibits no edema.  Neurological: She is alert and oriented to person, place, and time.  Skin: Skin is warm and dry.  Psychiatric: She has a normal mood and affect. Her behavior is normal. Judgment and thought content normal.   ED Course  Procedures (including critical care time)  Labs Reviewed - No  data to display No results found.  No diagnosis found.  MDM  IMPRESSION  Constipation  Arthralgias  hyperglycemia  RECOMMENDATIONS / PLAN Pt says that she is feeling much better now.  Ibuprofen prn joint pain, encouraged plenty of fluids RTC in 1 month to have CMP and A1c checked to rule out prediabetes Start colace 100 mg po bid  Follow up with her OB if her heavy bleeding continues, pt verbalized understanding  FOLLOW UP 1 month for labs : CMP, A1c   The patient was given clear instructions to go to ER or return to medical center if symptoms don't improve, worsen or new problems develop.  The patient verbalized understanding.  The patient was told to call to get lab results if they haven't heard anything in the next week.            Cleora Fleet,  MD 01/22/13 [phone]

## 2013-01-22 NOTE — ED Notes (Signed)
Follow up- was in hospital recently DX-constipation

## 2013-02-23 ENCOUNTER — Emergency Department (INDEPENDENT_AMBULATORY_CARE_PROVIDER_SITE_OTHER)
Admission: EM | Admit: 2013-02-23 | Discharge: 2013-02-23 | Disposition: A | Discharge status: Home / Self Care | Payer: Self-pay | Source: Home / Self Care

## 2013-02-23 LAB — COMPREHENSIVE METABOLIC PANEL
AST: 45 U/L — ABNORMAL HIGH (ref 0–37)
Albumin: 4.3 g/dL (ref 3.5–5.2)
BUN: 8 mg/dL (ref 6–23)
Calcium: 9.8 mg/dL (ref 8.4–10.5)
Creatinine, Ser: 0.72 mg/dL (ref 0.50–1.10)
Total Bilirubin: 0.3 mg/dL (ref 0.3–1.2)
Total Protein: 8.2 g/dL (ref 6.0–8.3)

## 2013-02-23 NOTE — ED Notes (Signed)
Pt here for lab work only cmp A1C

## 2013-02-25 LAB — HEMOGLOBIN A1C
Hgb A1c MFr Bld: 5.2 % (ref <5.7)
Mean Plasma Glucose: 103 mg/dL (ref <117)

## 2013-02-26 ENCOUNTER — Telehealth (HOSPITAL_COMMUNITY): Payer: Self-pay

## 2013-05-10 ENCOUNTER — Emergency Department (INDEPENDENT_AMBULATORY_CARE_PROVIDER_SITE_OTHER)
Admission: EM | Admit: 2013-05-10 | Discharge: 2013-05-10 | Disposition: A | Discharge status: Home / Self Care | Payer: Self-pay | Source: Home / Self Care | Attending: Family Medicine | Admitting: Family Medicine

## 2013-05-10 ENCOUNTER — Encounter (HOSPITAL_COMMUNITY): Payer: Self-pay | Admitting: Emergency Medicine

## 2013-05-10 DIAGNOSIS — K5900 Constipation, unspecified: Secondary | ICD-10-CM

## 2013-05-10 DIAGNOSIS — N3090 Cystitis, unspecified without hematuria: Secondary | ICD-10-CM

## 2013-05-10 HISTORY — DX: Complete or unspecified spontaneous abortion without complication: O03.9

## 2013-05-10 HISTORY — DX: Constipation, unspecified: K59.00

## 2013-05-10 HISTORY — DX: Unspecified ovarian cyst, unspecified side: N83.209

## 2013-05-10 LAB — POCT URINALYSIS DIP (DEVICE)
Glucose, UA: NEGATIVE mg/dL
Ketones, ur: NEGATIVE mg/dL
Leukocytes, UA: NEGATIVE
Protein, ur: NEGATIVE mg/dL
Specific Gravity, Urine: 1.02 (ref 1.005–1.030)
Urobilinogen, UA: 0.2 mg/dL (ref 0.0–1.0)

## 2013-05-10 LAB — POCT PREGNANCY, URINE: Preg Test, Ur: NEGATIVE

## 2013-05-10 MED ORDER — CEPHALEXIN 500 MG PO CAPS
500.0000 mg | ORAL_CAPSULE | Freq: Two times a day (BID) | ORAL | Status: DC
Start: 2013-05-10 — End: 2013-08-09

## 2013-05-10 MED ORDER — PHENAZOPYRIDINE HCL 200 MG PO TABS: 200.0000 mg | ORAL_TABLET | Freq: Three times a day (TID) | ORAL | Status: DC

## 2013-05-10 MED ORDER — POLYETHYLENE GLYCOL 3350 17 GM/SCOOP PO POWD: 17.0000 g | Freq: Every day | ORAL | Status: DC | PRN

## 2013-05-10 MED ORDER — TRAMADOL HCL 50 MG PO TABS: 50.0000 mg | ORAL_TABLET | Freq: Four times a day (QID) | ORAL | Status: DC | PRN

## 2013-05-10 NOTE — ED Notes (Addendum)
Pt c/o intermittent abd pain onset 2 weeks; seen here at [facility] in the past for same reason. Sx include: intermittent back pain, abd swelling, dysuria Denies: f/v/n/d, constipation, hematuria, vag d/c Hx of miscarriage, ovarian cyst, constipation Not taking any meds for discomfort; she is alert and oriented w/no signs of acute distress.

## 2013-05-10 NOTE — ED Provider Notes (Signed)
History     CSN: [id number]  Arrival date & time 05/10/13  1053   First MD Initiated Contact with Patient 05/10/13 1134      Chief Complaint  Patient presents with  . Abdominal Pain    (Consider location/radiation/quality/duration/timing/severity/associated sxs/prior treatment) HPI Comments: 17 year old female with a recent history of a urinary tract infection with inflammatory findings in her bladder and right ureter on CT scan status post antibiotic therapy. Also with history of constipation. Comes complaining of intermittent low abdominal discomfort and burning on urination for the last 2 weeks. Denies fever or chills. No general malaise nausea vomiting or diarrhea. Patient also reports intermittent constipation. Reports hard stool and straining during the week that last bowel movement this morning was normal and soft. Patient also complaining of intermittent abdominal distention. Denies headache, general malaise or dizziness. Denies vaginal discharge. She had negative GC and Chlamydia and normal wet prep on her recent hospital admission. Denies history of sexual transmitted diseases. Reports that he partner she has a 11-year-old son and reports taking oral contraceptives pills consistently.   Past Medical History  Diagnosis Date  . No pertinent past medical history   . Ovarian cyst   . Miscarriage   . Constipation     Past Surgical History  Procedure Laterality Date  . No past surgeries      No family history on file.  History  Substance Use Topics  . Smoking status: Never Smoker   . Smokeless tobacco: Not on file  . Alcohol Use: No    OB History   Grav Para Term Preterm Abortions TAB SAB Ect Mult Living   2 1 1       1       Review of Systems  Constitutional: Negative for fever, chills, diaphoresis, appetite change and fatigue.  Gastrointestinal: Positive for abdominal pain and constipation. Negative for nausea, vomiting and diarrhea.  Endocrine: Negative for cold  intolerance, heat intolerance, polydipsia, polyphagia and polyuria.  Genitourinary: Positive for dysuria. Negative for hematuria, vaginal bleeding, vaginal discharge, menstrual problem and pelvic pain.  Musculoskeletal: Negative for back pain.  Skin: Negative for rash.  Neurological: Negative for dizziness and headaches.    Allergies  Review of patient's allergies indicates no known allergies.  Home Medications   Current Outpatient Rx  Name  Route  Sig  Dispense  Refill  . cephALEXin (KEFLEX) 500 MG capsule   Oral   Take 1 capsule (500 mg total) by mouth 2 (two) times daily.   20 capsule   0   . phenazopyridine (PYRIDIUM) 200 MG tablet   Oral   Take 1 tablet (200 mg total) by mouth 3 (three) times daily.   6 tablet   0   . polyethylene glycol powder (GLYCOLAX/MIRALAX) powder   Oral   Take 17 g by mouth daily as needed.   255 g   0   . traMADol (ULTRAM) 50 MG tablet   Oral   Take 1 tablet (50 mg total) by mouth every 6 (six) hours as needed for pain.   15 tablet   0     BP 115/63  Pulse 91  Temp(Src) 98 F (36.7 C) (Oral)  Resp 20  SpO2 98%  LMP 04/13/2013  Physical Exam  Nursing note and vitals reviewed. Constitutional: She is oriented to person, place, and time. She appears well-developed and well-nourished. No distress.  HENT:  Head: Normocephalic and atraumatic.  Mouth/Throat: Oropharynx is clear and moist.  Eyes: Conjunctivae are normal.  No scleral icterus.  Neck: Neck supple. No JVD present. No thyromegaly present.  Cardiovascular: Normal rate and regular rhythm.   Pulmonary/Chest: Effort normal and breath sounds normal.  Abdominal: Soft. Bowel sounds are normal. She exhibits no distension and no mass. There is no tenderness. There is no rebound and no guarding.  No CVT  Lymphadenopathy:    She has no cervical adenopathy.  Neurological: She is alert and oriented to person, place, and time.  Skin: No rash noted. She is not diaphoretic.    ED  Course  Procedures (including critical care time)  Labs Reviewed  URINE CULTURE  POCT URINALYSIS DIP (DEVICE)  POCT PREGNANCY, URINE   No results found.   1. Constipation   2. Cystitis       MDM  Normal abdominal examination. Patient afebrile here nontoxic on exam. Normal point-of-care urinalysis.neg upreg. Given patient's symptoms decided to treat with Keflex, tramadol and pyridium. Urine culture pending. Impress possible overactive bladder versus interstitial cystitis but no hematuria. Constipation treated with MiraLax. Recommended urology followup and provided her with a referral to followup as needed. Supportive care and red flags should prompt her return to medical attention discussed with patient and provided in writing.        Sharin Grave, MD 05/11/13 [phone]

## 2013-05-11 LAB — URINE CULTURE: Colony Count: NO GROWTH

## 2013-06-01 NOTE — Progress Notes (Signed)
Quick Note:  Please hava patient come back for CMP recheck ______ 

## 2013-06-04 ENCOUNTER — Telehealth: Payer: Self-pay | Admitting: *Deleted

## 2013-06-04 NOTE — Telephone Encounter (Signed)
06/04/13 Patient cant be reached at this number. P.Jozalynn Noyce,RN

## 2013-08-09 ENCOUNTER — Emergency Department (HOSPITAL_COMMUNITY)
Admission: EM | Admit: 2013-08-09 | Discharge: 2013-08-09 | Disposition: A | Discharge status: Home / Self Care | Payer: Self-pay | Source: Home / Self Care | Attending: Family Medicine | Admitting: Family Medicine

## 2013-08-09 ENCOUNTER — Encounter (HOSPITAL_COMMUNITY): Payer: Self-pay | Admitting: Emergency Medicine

## 2013-08-09 DIAGNOSIS — R102 Pelvic and perineal pain: Secondary | ICD-10-CM

## 2013-08-09 DIAGNOSIS — N3281 Overactive bladder: Secondary | ICD-10-CM

## 2013-08-09 DIAGNOSIS — N318 Other neuromuscular dysfunction of bladder: Secondary | ICD-10-CM

## 2013-08-09 DIAGNOSIS — N949 Unspecified condition associated with female genital organs and menstrual cycle: Secondary | ICD-10-CM

## 2013-08-09 LAB — POCT URINALYSIS DIP (DEVICE)
Glucose, UA: NEGATIVE mg/dL
Leukocytes, UA: NEGATIVE
Nitrite: NEGATIVE
Protein, ur: NEGATIVE mg/dL
Urobilinogen, UA: 0.2 mg/dL (ref 0.0–1.0)

## 2013-08-09 LAB — POCT PREGNANCY, URINE: Preg Test, Ur: NEGATIVE

## 2013-08-09 MED ORDER — NAPROXEN 500 MG PO TABS: 500.0000 mg | ORAL_TABLET | Freq: Two times a day (BID) | ORAL | Status: DC

## 2013-08-09 MED ORDER — OXYBUTYNIN CHLORIDE 5 MG PO TABS: 5.0000 mg | ORAL_TABLET | Freq: Two times a day (BID) | ORAL | Status: DC

## 2013-08-09 MED ORDER — METOCLOPRAMIDE HCL 10 MG PO TABS: ORAL_TABLET | ORAL | Status: DC

## 2013-08-09 NOTE — ED Provider Notes (Signed)
CSN: [id number]     Arrival date & time 08/09/13  1327 History     First MD Initiated Contact with Patient 08/09/13 1349     Chief Complaint  Patient presents with  . Urinary Tract Infection  . Shoulder Pain   (Consider location/radiation/quality/duration/timing/severity/associated sxs/prior Treatment) HPI Comments: 17 year old female with history of recurrent pelvic pain. Comes complaining of intermittent pelvic pain described as suprapubic discomfort, urinary urgency , stress incontinence and dyspareunia. Denies vaginal discharge or vaginal bleeding. Denies nausea vomiting or diarrhea. Reports normal regular menstrual periods she is currently on oral contraceptive pill for birth control. Denies fever or chills. Patient also with a vague multiple symptoms she describes episodes of headache associated with nausea and photophobia although this is not currently bothering her. Reports anxiety. She also states she has a lump in her left shoulder that has been there for 3 years after she delivered her son and bothers her sometimes. She is currently not taking any meds medications. I have seen patient with similar symptoms in the past and has had negative urine culture. [facility] in the past. She also wants me to check her heart as she has episodes of heart racing. No h/o thyroid disease. No weight loss.    Past Medical History  Diagnosis Date  . No pertinent past medical history   . Ovarian cyst   . Miscarriage   . Constipation    Past Surgical History  Procedure Laterality Date  . No past surgeries     History reviewed. No pertinent family history. History  Substance Use Topics  . Smoking status: Never Smoker   . Smokeless tobacco: Not on file  . Alcohol Use: No   OB History   Grav Para Term Preterm Abortions TAB SAB Ect Mult Living   2 1 1       1      Review of Systems  Constitutional: Negative for fever, chills,  diaphoresis and fatigue.  Eyes: Positive for photophobia.  Respiratory: Negative for shortness of breath.   Gastrointestinal: Positive for nausea. Negative for vomiting, abdominal pain, diarrhea and constipation.  Genitourinary: Positive for frequency, pelvic pain and dyspareunia. Negative for vaginal bleeding, vaginal discharge and genital sores.  Skin: Negative for rash.  Neurological: Positive for headaches.  All other systems reviewed and are negative.    Allergies  Review of patient's allergies indicates no known allergies.  Home Medications   Current Outpatient Rx  Name  Route  Sig  Dispense  Refill  . metoCLOPramide (REGLAN) 10 MG tablet      1 tab by mouth x1 for migraine headaches as needed.   10 tablet   0   . naproxen (NAPROSYN) 500 MG tablet   Oral   Take 1 tablet (500 mg total) by mouth 2 (two) times daily.   30 tablet   0   . oxybutynin (DITROPAN) 5 MG tablet   Oral   Take 1 tablet (5 mg total) by mouth 2 (two) times daily.   60 tablet   0   . polyethylene glycol powder (GLYCOLAX/MIRALAX) powder   Oral   Take 17 g by mouth daily as needed.   255 g   0    BP 130/95  Pulse 88  Temp(Src) 98.4 F (36.9 C) (Oral)  Resp 16  SpO2 99% Physical Exam  Nursing note and vitals reviewed. Constitutional: She is oriented to person, place, and time. She appears  well-developed and well-nourished. No distress.  HENT:  Head: Normocephalic and atraumatic.  Mouth/Throat: Oropharynx is clear and moist. No oropharyngeal exudate.  Eyes: Conjunctivae are normal. No scleral icterus.  Neck: No thyromegaly present.  Cardiovascular: Normal rate, regular rhythm and normal heart sounds.  Exam reveals no gallop and no friction rub.   No murmur heard. Pulmonary/Chest: Breath sounds normal.  Abdominal: Soft. Bowel sounds are normal. She exhibits no distension and no mass. There is no tenderness. There is no rebound and no guarding. Hernia confirmed negative in the right  inguinal area and confirmed negative in the left inguinal area.  Genitourinary: Vagina normal and uterus normal. There is no rash on the right labia. There is no rash on the left labia. Cervix exhibits no motion tenderness, no discharge and no friability. Right adnexum displays no mass, no tenderness and no fullness. Left adnexum displays no mass, no tenderness and no fullness. No erythema or bleeding around the vagina. No vaginal discharge found.  Lymphadenopathy:    She has no cervical adenopathy.       Right: No inguinal adenopathy present.       Left: No inguinal adenopathy present.  Neurological: She is alert and oriented to person, place, and time.  Skin: No rash noted. She is not diaphoretic.    ED Course   Procedures (including critical care time)  Labs Reviewed  POCT URINALYSIS DIP (DEVICE)  POCT PREGNANCY, URINE  CERVICOVAGINAL ANCILLARY ONLY   No results found. 1. Pelvic pain in female   2. Overactive bladder     MDM  Normal abdominal examination and pelvic exam. GC chlamydia and a firm test pending at the time of discharge. Impress anxiety component contributing. Discussed with patient and decided to try oxybutynin for possible overactive bladder. Also prescribed naproxen and metoclopramide for episodes of migraine associated with nausea. Recommended patient to followup with a primary care provider at the [facility]. Supportive care and red flags that should prompt her return to medical attention discussed with patient and provided in writing.  Sharin Grave, MD 08/10/13 1149

## 2013-08-09 NOTE — ED Notes (Addendum)
C/o UTI with abd pain for three days.   Left shoulder pain for three years after having her son.  OTC medications tried to no relief.

## 2013-08-10 ENCOUNTER — Other Ambulatory Visit (HOSPITAL_COMMUNITY)
Admission: RE | Admit: 2013-08-10 | Discharge: 2013-08-10 | Disposition: A | Discharge status: Home / Self Care | Payer: Self-pay | Source: Ambulatory Visit | Attending: Family Medicine | Admitting: Family Medicine

## 2013-08-10 DIAGNOSIS — Z113 Encounter for screening for infections with a predominantly sexual mode of transmission: Secondary | ICD-10-CM | POA: Insufficient documentation

## 2013-08-10 DIAGNOSIS — N760 Acute vaginitis: Secondary | ICD-10-CM | POA: Insufficient documentation

## 2013-08-13 ENCOUNTER — Telehealth (HOSPITAL_COMMUNITY): Payer: Self-pay | Admitting: *Deleted

## 2013-08-13 MED ORDER — METRONIDAZOLE 500 MG PO TABS: 500.0000 mg | ORAL_TABLET | Freq: Two times a day (BID) | ORAL | Status: DC

## 2013-08-13 NOTE — ED Notes (Signed)
GC/Chlamydia neg., Affirm: Candida and Trich neg., Gardnerella pos. 8/21 Message sent to Dr. Alfonse Ras. 8/25  Dr. Alfonse Ras e-prescribed Flagyl to [facility] at [address]. I called pt. and left a message to call. Kristen Weaver 08/13/2013

## 2013-08-14 ENCOUNTER — Telehealth (HOSPITAL_COMMUNITY): Payer: Self-pay | Admitting: *Deleted

## 2013-08-14 NOTE — ED Notes (Addendum)
1616  I called and left a message for pt. to call.  1727 Pt. called back.  Pt. verified x 2 and given results. Pt. asked if there was anyone who speaks [place]. I asked Rosa -registration clerk to give information to the pt. She informed pt. that she needs Flagyl for bacterial vaginosis, where to pick up Rx. and  no alcohol while taking this medication. Pt. voiced understanding. Vassie Moselle 08/14/2013

## 2014-10-21 ENCOUNTER — Encounter (HOSPITAL_COMMUNITY): Payer: Self-pay | Admitting: Emergency Medicine

## 2014-11-26 ENCOUNTER — Ambulatory Visit (INDEPENDENT_AMBULATORY_CARE_PROVIDER_SITE_OTHER): Payer: Self-pay | Admitting: Emergency Medicine

## 2014-11-26 VITALS — BP 116/84 | HR 119 | Temp 98.2°F | Resp 16 | Ht 58.75 in | Wt 116.2 lb

## 2014-11-26 DIAGNOSIS — H8110 Benign paroxysmal vertigo, unspecified ear: Secondary | ICD-10-CM

## 2014-11-26 DIAGNOSIS — R112 Nausea with vomiting, unspecified: Secondary | ICD-10-CM

## 2014-11-26 DIAGNOSIS — R51 Headache: Secondary | ICD-10-CM

## 2014-11-26 DIAGNOSIS — R519 Headache, unspecified: Secondary | ICD-10-CM

## 2014-11-26 MED ORDER — MECLIZINE HCL 25 MG PO TABS: 25.0000 mg | ORAL_TABLET | Freq: Three times a day (TID) | ORAL | Status: DC | PRN

## 2014-11-26 MED ORDER — ONDANSETRON 4 MG PO TBDP
8.0000 mg | ORAL_TABLET | Freq: Once | ORAL | Status: AC
  Administered 2014-11-26: 8 mg via ORAL

## 2014-11-26 NOTE — Progress Notes (Signed)
[facility] [address], [place] [address] [phone]  Date:  11/26/2014   Name:  Kristen Weaver   DOB:  1996/08/13   MRN:  [id number]  PCP:  Pcp Not In System    Chief Complaint: Emesis; Depression; and Dizziness   History of Present Illness:  Kristen Weaver is a 18 y.o. very pleasant female patient who presents with the following:  [place] woman with a concern that she is depressed.  Says well this morning and after school suddenly developed nausea and vomited twice.  Has a headache  Says she suddenly had onset of tearfulness. No stool change or fever or chills.  No cough or coryza.  Ate normally this morning. LMP finished yesterday.  G2P1A1.  No GU symptoms No history of depression or sadness.   Sleeps well.   Family in [place].  Married and in high school. No improvement with over the counter medications or other home remedies.  Denies other complaint or health concern today.    Patient Active Problem List   Diagnosis Date Noted  . Ureteritis 01/11/2013  . Leukocytosis 01/11/2013  . Hypokalemia 01/11/2013    Past Medical History  Diagnosis Date  . No pertinent past medical history   . Ovarian cyst   . Miscarriage   . Constipation     Past Surgical History  Procedure Laterality Date  . No past surgeries      History  Substance Use Topics  . Smoking status: Never Smoker   . Smokeless tobacco: Not on file  . Alcohol Use: No    History reviewed. No pertinent family history.  No Known Allergies  Medication list has been reviewed and updated.  No current outpatient prescriptions on file prior to visit.   No current facility-administered medications on file prior to visit.    Review of Systems:  As per HPI, otherwise negative.    Physical Examination: Filed Vitals:   11/26/14 1551  BP: 116/84  Pulse: 119  Temp: 98.2 F (36.8 C)  Resp: 16   Filed Vitals:   11/26/14 1551  Height: 4'  10.75" (1.492 m)  Weight: 116 lb 3.2 oz (52.708 kg)   Body mass index is 23.68 kg/(m^2). Ideal Body Weight: Weight in (lb) to have BMI = 25: 122.5  GEN: WDWN, NAD, Non-toxic, A & O x 3  Dry HEENT: Atraumatic, Normocephalic. Neck supple. No masses, No LAD. Ears and Nose: No external deformity. CV: RRR, No M/G/R. No JVD. No thrill. No extra heart sounds. PULM: CTA B, no wheezes, crackles, rhonchi. No retractions. No resp. distress. No accessory muscle use. ABD: S, NT, ND, +BS. No rebound. No HSM. EXTR: No c/c/e NEURO Normal gait.  Romberg and tandem gait intact PSYCH: Normally interactive. Conversant. Not depressed or anxious appearing.  Calm demeanor.    Assessment and Plan: Nausea and vomiting Dizziness  headache  Signed,  Phillips OdorJeffery Kendrell Lottman, MD

## 2014-11-26 NOTE — Patient Instructions (Signed)
Vrtigo (Vertigo)  Vrtigo es la sensacin de que se est moviendo estando quieto. Puede ser peligroso si ocurre cuando est trabajado, conduciendo vehculos o realizando actividades difciles.  CAUSAS  El vrtigo se produce cuando hay un conflicto en las seales que se envan al cerebro desde los sistemas visual y sensorial del cuerpo. Hay numerosas causas que Dole Foodoriginan este problema, entre las que se incluyen:   Infecciones, especialmente en el odo interno.  Nelia ShiUna mala reaccin a un medicamento o mal uso de alcohol y frmacos.  Abstinencia de drogas o alcohol.  Cambios rpidos de posicin, como al D.R. Horton, Incacostarse o darse vuelta en la cama.  Dolor de Surveyor, mineralscabeza migraoso.  Disminucin del flujo sanguneo hacia el cerebro.  Aumento de la presin en el cerebro por un traumatismo, infeccin, tumor o sangrado en la cabeza. SNTOMAS  Puede sentir como si el mundo da vueltas o va a caer al piso. Como hay problemas en el equilibrio, el vrtigo puede causar nuseas y vmitos. Tiene movimientos oculares involuntarios (nistagmus).  DIAGNSTICO  El vrtigo normalmente se diagnostica con un examen fsico. Si la causa no se conoce, el mdico puede indicar diagnstico por imgenes, como una resonancia magntica (imgenes por Health visitorresonancia magntica).  TRATAMIENTO  La mayor parte de los casos de vrtigo se resuelve sin TEFL teachertratamiento. Segn la causa, el mdico podr recetar ciertos medicamentos. Si se relaciona con la posicin del cuerpo, podr recomendarle movimientos o procedimientos para corregir el problema. En algunos casos raros, si la causa del vrtigo es un problema en el odo interno, necesitar [place]una ciruga.  INSTRUCCIONES PARA EL CUIDADO DOMICILIARIO  Siga las indicaciones del mdico.  Evite conducir vehculos.  Evite operar maquinarias pesadas.  Evite realizar tareas que seran peligrosas para usted u otras personas durante un episodio de vrtigo.  Comunquele al mdico si nota que ciertos medicamentos  parecen asociarse con las crisis. Algunos medicamentos que se usan para tratar los episodios, en Guardian Life Insurancealgunas personas los empeoran. SOLICITE ATENCIN MDICA DE INMEDIATO SI:  Los medicamentos no Samoaalivian las crisis o hacen que estas empeoren.  Tiene dificultad para hablar, caminar, siente debilidad o tiene problemas para Boeingusar los brazos, las manos o las piernas.  Comienza a sufrir un dolor de cabeza intenso.  Las nuseas y los vmitos no se Samoaalivian o se Press photographeragravan.  Aparecen trastornos visuales.  Un miembro de su familia nota cambios en su conducta.  Hay alguna modificacin en su trastorno que parece Holiday representativehacerlo empeorar en lugar de Scientist, clinical (histocompatibility and immunogenetics)mejorar. ASEGRESE DE QUE:   Comprende estas instrucciones.  Controlar su enfermedad.  Solicitar ayuda de inmediato si no mejora o si empeora. Document Released: 09/15/2005 Document Revised: 02/28/2012 [facility] Patient Information 2015 [place]ExitCare, [place]LLC. This information is not intended to replace advice given to you by your health care provider. Make sure you discuss any questions you have with your health care provider. Nuseas y Vmitos (Nausea and Vomiting) La nusea es la sensacin de Dentistmalestar en el estmago o de la necesidad de vomitar. El vmito es un reflejo por el que los contenidos del estmago salen por la boca. El vmito puede ocasionar prdida de lquidos del organismo (deshidratacin). Los nios y los ONEOKadultos mayores pueden deshidratarse rpidamente (en especial si tambin tienen diarrea). Las nuseas y los vmitos son sntoma de un trastorno o enfermedad. Es importante Emergency planning/management officeraveriguar la causa de los sntomas. CAUSAS  Irritacin directa de la membrana que cubre el Delphosestmago. Esta irritacin puede ser resultado del aumento de la produccin de cido, (reflujo gastroesofgico), infecciones, intoxicacin alimentaria, ciertos medicamentos (  como antinflamatorios no esteroideos), consumo de alcohol o de tabaco.  Seales del cerebro.Estas seales pueden ser un dolor  de cabeza, exposicin al calor, trastornos del odo interno, aumento de la presin en el cerebro por lesiones, infeccin, un tumor o conmocin cerebral, estmulos emocionales o problemas metablicos.  Una obstruccin en el tracto gastrointestinal (obstruccin intestinal).  Ciertas enfermedades como la diabetes, problemas en la vescula biliar, apendicitis, problemas renales, cncer, sepsis, sntomas atpicos de infarto o trastornos alimentarios.  Tratamientos mdicos como la quimioterapia y la radiacin.  Medicamentos que inducen al sueo (anestesia general) durante Cipriano Mileuna ciruga. DIAGNSTICO  El mdico podr solicitarle algunos anlisis si los problemas no mejoran luego de [address] D[address] Roadalgunos das. Tambin podrn pedirle anlisis si los sntomas son graves o si el motivo de los vmitos o las nuseas no est claro. Los American Electric Poweranlisis pueden ser:   Anlisis de [place]or[place].  Anlisis de Glens Fallssangre.  Pruebas de materia fecal.  Cultivos (para buscar evidencias de infeccin).  Radiografas u otros estudios por imgenes. Los Norfolk Southernresultados de las pruebas lo ayudarn al mdico a tomar decisiones acerca del mejor curso de tratamiento o la necesidad de Consecoanlisis adicionales.  TRATAMIENTO  Debe estar bien hidratado. Beba con frecuencia pequeas cantidades de lquido.Puede beber agua, bebidas deportivas, caldos claros o comer pequeos trocitos de hielo o gelatina para mantenerse hidratado.Cuando coma, hgalo lentamente para evitar las nuseas.Hay medicamentos para evitar las nuseas que pueden aliviarlo.  INSTRUCCIONES PARA EL CUIDADO DOMICILIARIO  Si su mdico le prescribe medicamentos tmelos como se le haya indicado.  Si no tiene hambre, no se fuerce a comer. Sin embargo, es necesario que tome lquidos.  Si tiene hambre alimntese con una dieta normal, a menos que el mdico le indique otra cosa.  Los mejores alimentos son Neomia Dearuna combinacin de carbohidratos complejos (arroz, trigo, papas, pan), carnes magras, yogur, frutas y  Sports administratorvegetales.  Evite los alimentos ricos en grasas porque dificultan la digestin.  Beba gran cantidad de lquido para mantener la orina de tono claro o color amarillo plido.  Si est deshidratado, consulte a su mdico para que le d instrucciones especficas para volver a hidratarlo. Los signos de deshidratacin son:  Franz DellMucha sed.  Labios y boca secos.  Mareos.  Larose Kellsrina oscura.  Disminucin de la frecuencia y cantidad de la [place]or[place]orina.  Confusin.  Tiene el pulso o la respiracin acelerados. SOLICITE ATENCIN MDICA DE INMEDIATO SI:  Vomita sangre o algo similar a la borra del caf.  La materia fecal (heces) es negra o tiene Gibsonsangre.  Sufre una cefalea grave o rigidez en el cuello.  Se siente confundido.  Siente dolor abdominal intenso.  Tiene dolor en el pecho o dificultad para respirar.  No orina por 8 horas.  Tiene la piel fra y pegajosa.  Sigue vomitando durante ms de 24 a 48 horas.  Tiene fiebre. ASEGRESE QUE:   Comprende estas instrucciones.  Controlar su enfermedad.  Solicitar ayuda inmediatamente si no mejora o si empeora. Document Released: 12/26/2007 Document Revised: 02/28/2012 [facility] CenterExitCare Patient Information 2015 [place]pherdsvilleExitCare, MarylandLLC. This information is not intended to replace advice given to you by your health care provider. Make sure you discuss any questions you have with your health care provider.

## 2015-01-28 ENCOUNTER — Ambulatory Visit: Payer: Self-pay | Attending: Internal Medicine

## 2015-06-23 ENCOUNTER — Emergency Department (HOSPITAL_COMMUNITY): Payer: Self-pay

## 2015-06-23 ENCOUNTER — Encounter (HOSPITAL_COMMUNITY): Payer: Self-pay | Admitting: Emergency Medicine

## 2015-06-23 ENCOUNTER — Emergency Department (HOSPITAL_COMMUNITY)
Admission: EM | Admit: 2015-06-23 | Discharge: 2015-06-23 | Disposition: A | Discharge status: Home / Self Care | Payer: Self-pay | Attending: Emergency Medicine | Admitting: Emergency Medicine

## 2015-06-23 DIAGNOSIS — R000 Tachycardia, unspecified: Secondary | ICD-10-CM | POA: Insufficient documentation

## 2015-06-23 DIAGNOSIS — Z8742 Personal history of other diseases of the female genital tract: Secondary | ICD-10-CM | POA: Insufficient documentation

## 2015-06-23 DIAGNOSIS — Z79899 Other long term (current) drug therapy: Secondary | ICD-10-CM | POA: Insufficient documentation

## 2015-06-23 DIAGNOSIS — Z8719 Personal history of other diseases of the digestive system: Secondary | ICD-10-CM | POA: Insufficient documentation

## 2015-06-23 DIAGNOSIS — Z3202 Encounter for pregnancy test, result negative: Secondary | ICD-10-CM | POA: Insufficient documentation

## 2015-06-23 DIAGNOSIS — R0789 Other chest pain: Secondary | ICD-10-CM | POA: Insufficient documentation

## 2015-06-23 LAB — I-STAT TROPONIN, ED: TROPONIN I, POC: 0 ng/mL (ref 0.00–0.08)

## 2015-06-23 LAB — BASIC METABOLIC PANEL
ANION GAP: 8 (ref 5–15)
BUN: 10 mg/dL (ref 6–20)
CALCIUM: 9.3 mg/dL (ref 8.9–10.3)
CO2: 23 mmol/L (ref 22–32)
Chloride: 108 mmol/L (ref 101–111)
Creatinine, Ser: 0.86 mg/dL (ref 0.44–1.00)
GFR calc Af Amer: >60 mL/min (ref >60)
GFR calc non Af Amer: >60 mL/min (ref >60)
GLUCOSE: 106 mg/dL — AB (ref 65–99)
POTASSIUM: 4.2 mmol/L (ref 3.5–5.1)
SODIUM: 139 mmol/L (ref 135–145)

## 2015-06-23 LAB — CBC
HEMATOCRIT: 39.8 % (ref 36.0–46.0)
HEMOGLOBIN: 13.3 g/dL (ref 12.0–15.0)
MCH: 29.7 pg (ref 26.0–34.0)
MCHC: 33.4 g/dL (ref 30.0–36.0)
MCV: 88.8 fL (ref 78.0–100.0)
PLATELETS: 332 10*3/uL (ref 150–400)
RBC: 4.48 MIL/uL (ref 3.87–5.11)
RDW: 12.7 % (ref 11.5–15.5)
WBC: 7.6 10*3/uL (ref 4.0–10.5)

## 2015-06-23 LAB — I-STAT BETA HCG BLOOD, ED (MC, WL, AP ONLY)

## 2015-06-23 LAB — D-DIMER, QUANTITATIVE: D-Dimer, Quant: 0.27 ug/mL-FEU (ref 0.00–0.48)

## 2015-06-23 NOTE — ED Notes (Signed)
Patient here with complaints of centralized chest pain that started today. Denies smoking history.  Patient speaks very little AlbaniaEnglish.

## 2015-06-23 NOTE — Discharge Instructions (Signed)
You were evaluated in the ED today for chest discomfort. Your labs, exam, EKG and chest x-ray are all reassuring. Please follow-up with your primary care for further evaluation management of your symptoms. Return to ED for any worsening symptoms.  Dolor de pecho (no especfico) (Chest Pain (Nonspecific)) Con frecuencia es difcil dar un diagnstico especfico de la causa del dolor de Lakeland Shorespecho. Siempre hay una posibilidad de que el dolor podra estar relacionado con algo grave, como un ataque al corazn o un cogulo sanguneo en los pulmones. Debe someterse a controles con el mdico para ms evaluaciones. CAUSAS   Acidez.  Neumona o bronquitis.  Ansiedad o estrs.  Inflamacin de la zona que rodea al corazn (pericarditis) o a los pulmones (pleuritis o pleuresa).  Un cogulo sanguneo en el pulmn.  Colapso de un pulmn (neumotrax), que puede aparecer de Regions Financial Corporationmanera repentina por s solo (neumotrax espontneo) o debido a un traumatismo en el trax.  Culebrilla (virus del herpes zster). La pared torcica est compuesta por huesos, msculos y TEFL teachercartlago. Cualquiera de estos puede ser la fuente del dolor.  Puede haber una contusin en los huesos debido a una lesin.  Puede haber un esguince en los msculos o el cartlago ocasionado por la tos o por Storm Laketrabajo excesivo.  El cartlago puede verse afectado por una inflamacin y Psychologist, counsellingprovocar dolor (costocondritis). DIAGNSTICO  Gretchen ShortQuizs se necesiten anlisis de laboratorio u otros estudios para Veterinary surgeonencontrar la causa del Engineer, miningdolor. Adems, puede indicarle que se haga una prueba llamada electrocadiograma (ECG) ambulatorio. El ECG registra los patrones de los latidos cardacos durante 24horas. Adems, pueden hacerle otros estudios, por ejemplo:  Ecocardiograma transtorcico (ETT). Durante Management consultantel ecocardiograma, se usan ondas sonoras para evaluar el flujo de la sangre a travs del corazn.  Ecocardiograma transesofgico (ETE).  Monitoreo cardaco. Permite que el  mdico controle la frecuencia y el ritmo cardaco en tiempo real.  Monitor Holter. Es un dispositivo porttil que eBayregistra los latidos cardacos y Saint Vincent and the Grenadinesayuda a Education administratordiagnosticar las arritmias cardacas. Le permite al American Expressmdico registrar la actividad cardaca durante varios das, si es necesario.  Pruebas de estrs por ejercicio o por medicamentos que aceleran los latidos cardacos. TRATAMIENTO   El tratamiento depende de la causa del dolor de Shickshinnypecho. El tratamiento puede incluir:  Inhibidores de la acidez estomacal.  Antiinflamatorios.  Analgsicos para las enfermedades inflamatorias.  Antibiticos, si hay una infeccin.  Podrn aconsejarle que modifique su estilo de vida. Esto incluye dejar de fumar y evitar el alcohol, la cafena y el chocolate.  Pueden aconsejarle que mantenga la cabeza levantada (elevada) cuando duerme. Esto reduce la probabilidad de que el cido retroceda del estmago al esfago. En la International Business Machinesmayora de los casos, el dolor de pecho no especfico mejorar en el trmino de 2 a 3das, con reposo y IAC/InterActiveCorpanalgsicos suaves.  INSTRUCCIONES PARA EL CUIDADO EN EL HOGAR   Si le prescriben antibiticos, tmelos tal como se le indic. Termnelos aunque comience a sentirse mejor.  [address]Durante los das siguientes, no haga actividades fsicas que provoquen dolor de [place]cho. Contine con las actividades fsicas tal como se le indic  No consuma ningn producto que contenga tabaco, incluidos cigarrillos, tabaco de Theatre managermascar o cigarrillos electrnicos.  Evite el consumo de alcohol.  Tome los medicamentos solamente como se lo haya indicado el mdico.  Siga las sugerencias del mdico en lo que respecta a las pruebas adicionales, si el dolor de pecho no desaparece.  Concurra a todas las visitas de control programadas. Si no lo hace, podra desarrollar problemas permanentes (crnicos)  relacionados con Chief Technology Officer. Si hay algn problema para concurrir a una cita, llame para reprogramarla. SOLICITE ATENCIN MDICA SI:    El dolor de pecho no desaparece, incluso despus del tratamiento.  Tiene una erupcin cutnea con ampollas en el pecho.  Tiene fiebre. SOLICITE ATENCIN MDICA DE [employer]el[employer] Corporation SI:   Aumenta el dolor de pecho o este se irradia hacia el brazo, el cuello, la [place], la espalda o el abdomen.  Le falta el aire.  La tos empeora, o expectora sangre.  Siente dolor intenso en la espalda o el abdomen.  Se siente nauseoso o vomita.  Siente debilidad intensa.  Se desmaya.  Tiene escalofros. Esto es Radio broadcast assistant. No espere a ver si el dolor se pasa. Obtenga ayuda mdica de inmediato. Llame a los servicios de emergencia locales (911 en los [place]). No conduzca por sus propios medios [employer] hospital. ASEGRESE DE QUE:   Comprende estas instrucciones.  Controlar su afeccin.  Recibir ayuda de inmediato si no mejora o si empeora. Document Released: 12/06/2005 Document Revised: 12/11/2013 [facility] Patient Information 2015 [place], [place]. This information is not intended to replace advice given to you by your health care provider. Make sure you discuss any questions you have with your health care provider.

## 2015-06-23 NOTE — ED Notes (Signed)
Patient requesting an interpreter.  Secretary Catering manager(Amber) was notified and will advise when available.

## 2015-06-23 NOTE — ED Provider Notes (Signed)
CSN: [id number]     Arrival date & time 06/23/15  1331 History   First MD Initiated Contact with Patient 06/23/15 1344     Chief Complaint  Patient presents with  . Chest Pain     (Consider location/radiation/quality/duration/timing/severity/associated sxs/prior Treatment) HPI Kristen Weaver is a 19 y.o. female comes in for evaluation of chest pain. Patient speaks Spanish and history is somewhat limited by language barrier. Patient is accompanied by her husband who contributes to history of present illness. Patient states she has had intermittent, central chest pressure with fleeting sharp pains for the past 3 weeks. She reports having seen "someone at the clinic", but cannot remember who for the same problem. She was discharged with naproxen and Flexeril. Patient also taking atenolol every day at 2 PM, has not taken this medicine today. She reports associated dizziness and seeing spots in her vision. Denies any nausea, vomiting or abdominal pain. No numbness or weakness, shortness of breath. No recent travel, hemoptysis, leg swelling, no history of blood clot. Reports last menstrual period was last month on the 16th and was normal for her.  Past Medical History  Diagnosis Date  . No pertinent past medical history   . Ovarian cyst   . Miscarriage   . Constipation    Past Surgical History  Procedure Laterality Date  . No past surgeries     No family history on file. History  Substance Use Topics  . Smoking status: Never Smoker   . Smokeless tobacco: Not on file  . Alcohol Use: No   OB History    Gravida Para Term Preterm AB TAB SAB Ectopic Multiple Living   Review of Systems A 10 point review of systems was completed and was negative except for pertinent positives and negatives as mentioned in the history of present illness     Allergies  Review of patient's allergies indicates no known allergies.  Home Medications   Prior to Admission  medications   Medication Sig Start Date End Date Taking? Authorizing Provider  atenolol (TENORMIN) 50 MG tablet Take 50 mg by mouth daily.   Yes Historical Provider, MD  cyclobenzaprine (FLEXERIL) 10 MG tablet Take 10 mg by mouth at bedtime.   Yes Historical Provider, MD  naproxen (NAPROSYN) 500 MG tablet Take 500 mg by mouth 2 (two) times daily as needed for moderate pain.   Yes Historical Provider, MD  norgestimate-ethinyl estradiol (ORTHO-CYCLEN,SPRINTEC,PREVIFEM) 0.25-35 MG-MCG tablet Take 1 tablet by mouth daily.   Yes Historical Provider, MD   BP 123/74 mmHg  Pulse 109  Temp(Src) 98.5 F (36.9 C) (Oral)  Resp 18  SpO2 100%  LMP 06/04/2015 Physical Exam  Constitutional: She is oriented to person, place, and time. She appears well-developed and well-nourished.  HENT:  Head: Normocephalic and atraumatic.  Mouth/Throat: Oropharynx is clear and moist.  Eyes: Conjunctivae are normal. Pupils are equal, round, and reactive to light. Right eye exhibits no discharge. Left eye exhibits no discharge. No scleral icterus.  Neck: Neck supple.  Cardiovascular: Regular rhythm and normal heart sounds.   Mild tachycardia  Pulmonary/Chest: Effort normal and breath sounds normal. No respiratory distress. She has no wheezes. She has no rales.  Abdominal: Soft. There is no tenderness.  Musculoskeletal: She exhibits no edema or tenderness.  Neurological: She is alert and oriented to person, place, and time.  Cranial Nerves II-XII grossly intact  Skin: Skin is warm and  dry. No rash noted.  Psychiatric: She has a normal mood and affect.  Nursing note and vitals reviewed.   ED Course  Procedures (including critical care time) Labs Review Labs Reviewed  BASIC METABOLIC PANEL - Abnormal; Notable for the following:    Glucose, Bld 106 (*)    All other components within normal limits  CBC  D-DIMER, QUANTITATIVE (NOT [facility])  I-STAT TROPOININ, ED  I-STAT BETA HCG BLOOD, ED ([facility], [facility], [facility] ONLY)     Imaging Review Dg Chest 2 View  06/23/2015   CLINICAL DATA:  Chest pain for 3 weeks  EXAM: CHEST  2 VIEW  COMPARISON:  None.  FINDINGS: The heart size and mediastinal contours are within normal limits. Both lungs are clear. The visualized skeletal structures are unremarkable.  IMPRESSION: No active cardiopulmonary disease.   Electronically Signed   By: Sherian ReinWei-Chen  Lin M.D.   On: 06/23/2015 15:16     EKG Interpretation   Date/Time:  Monday June 23 2015 13:43:29 EDT Ventricular Rate:  94 PR Interval:  138 QRS Duration: 92 QT Interval:  355 QTC Calculation: 444 R Axis:   74 Text Interpretation:  Sinus rhythm Borderline Q waves in lateral leads  Nonspecific T abnormalities, inferior leads  Confirmed by Juleen ChinaKOHUT  MD,  STEPHEN ([id number]) on 06/23/2015 1:54:54 PM     Meds given in ED:  Medications - No data to display  New Prescriptions   No medications on file   Filed Vitals:   06/23/15 1341  BP: 123/74  Pulse: 109  Temp: 98.5 F (36.9 C)  TempSrc: Oral  Resp: 18  SpO2: 100%    MDM  Vitals stable - WNL -afebrile, upon recheck patient heart rate is 74 beats without intervention, no apparent distress. Pt resting comfortably in ED.  PE--Lung exam Normal. Cardiac auscultation reveals no murmurs rubs or gallops. Grossly Benign Physical Exam Labwork: Initial/DeltaTroponin negative. EKG reassuring. D-Dimer negative Labs otherwise noncontributory Imaging: CXR shows  no acute current bony pathology I personally reviewed the imaging and agree with the results as interpreted by the radiologist.   DDX: Patient with atypical chest discomfort ongoing for the past 3 weeks. Clinical picture and exam today not consistent with ACS/dissection. Heart score 1 due to risk factors. No evidence of spontaneous pneumothorax, esophageal rupture or other mediastinitis. PERC negative, doubt PE. No evidence of myocarditis, endocarditis, pericarditis.  Patient has naproxen and Flexeril at home she can continue  to take for discomfort. I discussed all relevant lab findings and imaging results with pt and they verbalized understanding. Discussed f/u with PCP within 48 hrs and return precautions, pt very amenable to plan. Prior to patient discharge, I discussed and reviewed this case with Dr. Juleen ChinaKohut    Final diagnoses:  Chest discomfort        Joycie PeekBenjamin Lazarius Rivkin, PA-C 06/23/15 1600  Raeford RazorStephen Kohut, MD 06/24/15 [id number]

## 2016-12-19 ENCOUNTER — Emergency Department (HOSPITAL_COMMUNITY): Payer: Self-pay

## 2016-12-19 ENCOUNTER — Emergency Department (HOSPITAL_COMMUNITY)
Admission: EM | Admit: 2016-12-19 | Discharge: 2016-12-20 | Disposition: A | Discharge status: Home / Self Care | Payer: Self-pay | Attending: Emergency Medicine | Admitting: Emergency Medicine

## 2016-12-19 ENCOUNTER — Other Ambulatory Visit: Payer: Self-pay

## 2016-12-19 DIAGNOSIS — R002 Palpitations: Secondary | ICD-10-CM | POA: Insufficient documentation

## 2016-12-19 LAB — BASIC METABOLIC PANEL
Anion gap: 13 (ref 5–15)
BUN: 6 mg/dL (ref 6–20)
CO2: 23 mmol/L (ref 22–32)
CREATININE: 0.65 mg/dL (ref 0.44–1.00)
Calcium: 9.9 mg/dL (ref 8.9–10.3)
Chloride: 104 mmol/L (ref 101–111)
GFR calc non Af Amer: >60 mL/min (ref >60)
Glucose, Bld: 110 mg/dL — ABNORMAL HIGH (ref 65–99)
Potassium: 3.3 mmol/L — ABNORMAL LOW (ref 3.5–5.1)
Sodium: 140 mmol/L (ref 135–145)

## 2016-12-19 LAB — CBC
HCT: 42.4 % (ref 36.0–46.0)
Hemoglobin: 14.6 g/dL (ref 12.0–15.0)
MCH: 30 pg (ref 26.0–34.0)
MCHC: 34.4 g/dL (ref 30.0–36.0)
MCV: 87.2 fL (ref 78.0–100.0)
PLATELETS: 376 10*3/uL (ref 150–400)
RBC: 4.86 MIL/uL (ref 3.87–5.11)
RDW: 12.3 % (ref 11.5–15.5)
WBC: 9.2 10*3/uL (ref 4.0–10.5)

## 2016-12-19 LAB — I-STAT TROPONIN, ED: Troponin i, poc: 0 ng/mL (ref 0.00–0.08)

## 2016-12-19 NOTE — ED Provider Notes (Signed)
[facility]-EMER[facility]ENCY DEPT Provider Note   CSN: [id number] Arrival date & time: 12/19/16  2102    History   Chief Complaint Chief Complaint  Patient presents with  . Chest Pain  . Tachycardia    HPI Kristen Weaver Reasonersmelda Dominguez Romero is a 20 y.o. female who presents with palpitations and chest pain. She states that over the past week she has felt palpitations. She endorses chest pain when she is having the palpitations. Chest pain is on the left side of her chest and is nonradiating. She has the palpitations on exertion. She reports associated fatigue, diaphoresis, chills, and SOB. She has had this pain before about a year ago, was seen in the ED, and was told that nothing was wrong. It appears she did have a ED visit in July 2016 for chest pain. She had a chest pain work up which was negative. She has not followed up with any provider about this problem since then but has had pain intermittently since then. Denies fever, URI symptoms, leg swelling, cough, wheezing. She is on birth control. Spanish interpreter was used. Husband is at bedside.  HPI  Past Medical History:  Diagnosis Date  . Constipation   . Miscarriage   . No pertinent past medical history   . Ovarian cyst     Patient Active Problem List   Diagnosis Date Noted  . Ureteritis 01/11/2013  . Leukocytosis 01/11/2013  . Hypokalemia 01/11/2013    Past Surgical History:  Procedure Laterality Date  . NO PAST SURGERIES      OB History    Gravida Para Term Preterm AB Living   2 1 1     1    SAB TAB Ectopic Multiple Live Births                   Home Medications    Prior to Admission medications   Not on File    Family History No family history on file.  Social History Social History  Substance Use Topics  . Smoking status: Never Smoker  . Smokeless tobacco: Not on file  . Alcohol use No     Allergies   Patient has no known allergies.  Review of Systems Review of Systems  Constitutional: Positive for chills,  diaphoresis and fatigue. Negative for fever.  Respiratory: Positive for shortness of breath. Negative for cough.   Cardiovascular: Positive for chest pain and palpitations. Negative for leg swelling.  Gastrointestinal: Negative for nausea and vomiting.  Neurological: Negative for syncope and light-headedness.  All other systems reviewed and are negative.   Physical Exam Updated Vital Signs BP 119/75   Pulse 81   Temp 98.3 F (36.8 C) (Oral)   Resp 17   LMP 12/15/2016 (Exact Date)   SpO2 96%   Physical Exam  Constitutional: She is oriented to person, place, and time. She appears well-developed and well-nourished. No distress.  HENT:  Head: Normocephalic and atraumatic.  Eyes: Conjunctivae are normal. Pupils are equal, round, and reactive to light. Right eye exhibits no discharge. Left eye exhibits no discharge. No scleral icterus.  Neck: Normal range of motion.  Cardiovascular: Regular rhythm.  Tachycardia present.  Exam reveals no gallop and no friction rub.   No murmur heard. Pulmonary/Chest: Effort normal and breath sounds normal. No respiratory distress. She has no wheezes. She has no rales. She exhibits no tenderness.  Abdominal: She exhibits no distension.  Neurological: She is alert and oriented to person, place, and time.  Skin: Skin is warm and  dry.  Psychiatric: She has a normal mood and affect. Her behavior is normal.  Nursing note and vitals reviewed.   ED Treatments / Results  Labs (all labs ordered are listed, but only abnormal results are displayed) Labs Reviewed  BASIC METABOLIC PANEL - Abnormal; Notable for the following:       Result Value   Potassium 3.3 (*)    Glucose, Bld 110 (*)    All other components within normal limits  CBC  D-DIMER, QUANTITATIVE (NOT [facility])  Rosezena SensorI-STAT TROPOININ, ED    EKG   Radiology Dg Chest 2 View  Result Date: 12/19/2016 CLINICAL DATA:  Fast heart for 1 week intermittent, left-sided chest pain EXAM: CHEST  2 VIEW  COMPARISON:  06/23/2015 FINDINGS: The heart size and mediastinal contours are within normal limits. Both lungs are clear. The visualized skeletal structures are unremarkable. IMPRESSION: No active cardiopulmonary disease. Electronically Signed   By: Jasmine PangKim  Fujinaga M.D.   On: 12/19/2016 21:32    Procedures Procedures (including critical care time)  Medications Ordered in ED Medications - No data to display   Initial Impression / Assessment and Plan / ED Course  I have reviewed the triage vital signs and the nursing notes.  Pertinent labs & imaging results that were available during my care of the patient were reviewed by me and considered in my medical decision making (see chart for details).  Clinical Course    20 year old female presents with palpitations and intermittent chest pain. She is mildly tachycardic at times. Otherwise vitals appear normal. Chest pain work up is reassuring. EKG is sinus tachycardia with S1 Q3 T3 which is apparently not new. CXR is negative. Initial troponin is 0. Labs are unremarkable. HEART score is 1. She is not PERC negative due to tachycardia and birth control therefore D-dimer was obtained which was normal. Will not obtain delta trop since chest pain has been ongoing on and off for a week. Will d/c with Cardiology f/u for additional outpatient workup. Patient is NAD, non-toxic, with stable VS. Patient is informed of clinical course, understands medical decision making process, and agrees with plan. Opportunity for questions provided and all questions answered. Return precautions given.   Final Clinical Impressions(s) / ED Diagnoses   Final diagnoses:  Palpitations    New Prescriptions New Prescriptions   No medications on file     Bethel BornKelly Marie Natelie Ostrosky, PA-C 12/20/16 1512    Marily MemosJason Mesner, MD 12/24/16 [phone]

## 2016-12-19 NOTE — ED Triage Notes (Signed)
Pt reports "fast heart" for one week, states intermittent over the last week but worse today. States felt today like she was going to pass out. Reports L sided chest pain.

## 2016-12-20 LAB — D-DIMER, QUANTITATIVE: D-Dimer, Quant: <0.27 ug/mL-FEU (ref 0.00–0.50)

## 2016-12-20 NOTE — Discharge Instructions (Signed)
Please follow up with cardiology

## 2017-01-10 NOTE — Progress Notes (Signed)
     HPI: 21 year old female for evaluation of palpitations. Patient does not speak [place] and history is obtained with the assistance of an interpreter. Seen in the emergency room December 31 and Jan 26 with palpitations. Troponin and d-dimer normal. Chest x-ray negative. Potassium 3.3 and hemoglobin 14.6. TSH January 2018 1.215. K 3.5. Patient apparently has a long history of palpitations described as her heart racing. They can last all day. She has mild associated chest pain. There is some dizziness but no syncope. She occasionally has some dyspnea on exertion but no orthopnea, PND or pedal edema. She does not have chest pain other than when she is having her palpitations.  Current Outpatient Prescriptions  Medication Sig Dispense Refill  . metoprolol tartrate (LOPRESSOR) 25 MG tablet Take 0.5 tablets (12.5 mg total) by mouth 2 (two) times daily. 30 tablet 0  . orphenadrine (NORFLEX) 100 MG tablet Take 1 tablet (100 mg total) by mouth 2 (two) times daily. 30 tablet 0   No current facility-administered medications for this visit.     No Known Allergies   Past Medical History:  Diagnosis Date  . Constipation   . Miscarriage   . No pertinent past medical history   . Ovarian cyst     Past Surgical History:  Procedure Laterality Date  . NO PAST SURGERIES      Social History   Social History  . Marital status: Married    Spouse name: N/A  . Number of children: 1  . Years of education: N/A   Occupational History  . Not on file.   Social History Main Topics  . Smoking status: Never Smoker  . Smokeless tobacco: Never Used  . Alcohol use Yes     Comment: Occasional  . Drug use: No  . Sexual activity: Yes    Birth control/ protection: Pill   Other Topics Concern  . Not on file   Social History Narrative  . No narrative on file    Family History  Problem Relation Age of Onset  . Family history unknown: Yes    ROS: Occasionally feels "cold" and HA but no fevers or  chills, productive cough, hemoptysis, dysphasia, odynophagia, melena, hematochezia, dysuria, hematuria, rash, seizure activity, orthopnea, PND, pedal edema, claudication. Remaining systems are negative.  Physical Exam:   Blood pressure 117/78, pulse 98, height 5\' 2"  (1.575 m), weight 120 lb (54.4 kg).  General:  Well developed/well nourished in NAD Skin warm/dry Patient not depressed, mildly anxious appearing No peripheral clubbing Back-normal HEENT-normal/normal eyelids Neck supple/normal carotid upstroke bilaterally; No bruits no JVD; no thyromegaly chest - CTA/ normal expansion CV - RRR/normal S1 and S2; no murmurs, rubs or gallops;  PMI nondisplaced Abdomen -NT/ND, no HSM, no mass, + bowel sounds, no bruit 2+ femoral pulses, no bruits Ext-no edema, chords, 2+ DP Neuro-grossly nonfocal  ECG 12/19/2016-sinus tachycardia, right atrial enlargement, nonspecific ST changes.  01/15/2016-sinus tachycardia with nonspecific ST changes.  A/P  1 palpitations-etiology unclear. TSH and hemoglobin normal. We will continue with metoprolol which was initiated in the emergency room. Check echocardiogram for LV function. Check an event monitor.  2 chest pain-occurs only with palpitations. Echocardiogram to assess LV function and wall motion.  Olga MillersBrian Crenshaw MD

## 2017-01-14 ENCOUNTER — Emergency Department (HOSPITAL_COMMUNITY)
Admission: EM | Admit: 2017-01-14 | Discharge: 2017-01-15 | Disposition: A | Discharge status: Home / Self Care | Payer: Self-pay | Attending: Emergency Medicine | Admitting: Emergency Medicine

## 2017-01-14 ENCOUNTER — Encounter (HOSPITAL_COMMUNITY): Payer: Self-pay | Admitting: Emergency Medicine

## 2017-01-14 DIAGNOSIS — Z79899 Other long term (current) drug therapy: Secondary | ICD-10-CM | POA: Insufficient documentation

## 2017-01-14 DIAGNOSIS — R000 Tachycardia, unspecified: Secondary | ICD-10-CM | POA: Insufficient documentation

## 2017-01-14 LAB — I-STAT BETA HCG BLOOD, ED (MC, WL, AP ONLY): I-stat hCG, quantitative: <5 m[IU]/mL (ref <5)

## 2017-01-14 LAB — BASIC METABOLIC PANEL
Anion gap: 11 (ref 5–15)
BUN: 6 mg/dL (ref 6–20)
CHLORIDE: 107 mmol/L (ref 101–111)
CO2: 23 mmol/L (ref 22–32)
CREATININE: 0.74 mg/dL (ref 0.44–1.00)
Calcium: 9.7 mg/dL (ref 8.9–10.3)
GFR calc Af Amer: >60 mL/min (ref >60)
GFR calc non Af Amer: >60 mL/min (ref >60)
GLUCOSE: 101 mg/dL — AB (ref 65–99)
Potassium: 3.5 mmol/L (ref 3.5–5.1)
SODIUM: 141 mmol/L (ref 135–145)

## 2017-01-14 LAB — CBC
HCT: 40.5 % (ref 36.0–46.0)
HEMOGLOBIN: 13.7 g/dL (ref 12.0–15.0)
MCH: 29.6 pg (ref 26.0–34.0)
MCHC: 33.8 g/dL (ref 30.0–36.0)
MCV: 87.5 fL (ref 78.0–100.0)
Platelets: 364 10*3/uL (ref 150–400)
RBC: 4.63 MIL/uL (ref 3.87–5.11)
RDW: 12.4 % (ref 11.5–15.5)
WBC: 9.6 10*3/uL (ref 4.0–10.5)

## 2017-01-14 LAB — D-DIMER, QUANTITATIVE: D-Dimer, Quant: 0.28 ug/mL-FEU (ref 0.00–0.50)

## 2017-01-14 LAB — TSH: TSH: 1.215 u[IU]/mL (ref 0.350–4.500)

## 2017-01-14 NOTE — ED Triage Notes (Signed)
Pt reports feeling like her heart is racing and pain in her chest that began last week. Reports feeling lightheaded, hx of the same.

## 2017-01-15 MED ORDER — METOPROLOL TARTRATE 25 MG PO TABS
12.5000 mg | ORAL_TABLET | Freq: Once | ORAL | Status: AC
Start: 2017-01-15 — End: 2017-01-15
  Administered 2017-01-15: 12.5 mg via ORAL
  Filled 2017-01-15: qty 1

## 2017-01-15 MED ORDER — METOPROLOL TARTRATE 25 MG PO TABS: 12.5000 mg | ORAL_TABLET | Freq: Two times a day (BID) | ORAL | 0 refills | Status: DC

## 2017-01-15 MED ORDER — ORPHENADRINE CITRATE ER 100 MG PO TB12: 100.0000 mg | ORAL_TABLET | Freq: Two times a day (BID) | ORAL | 0 refills | Status: DC

## 2017-01-15 NOTE — ED Provider Notes (Signed)
[facility] Provider Note   CSN: [id number] Arrival date & time: 01/14/17  1839     History   Chief Complaint Chief Complaint  Patient presents with  . Tachycardia    HPI Kristen Weaver is a 21 y.o. female.  HPI Patient has heart racing that comes and goes. Sometimes things are completely normal. Other times her heart will just start beating fast and hard for no particular reason. She could be at home just doing her usual activities. When it happens, she feels chest pain and shortness of breath. She also feels lightheaded. This has been coming and going for several months now. She reports is starting to get to be very upsetting because she doesn't know when his clinic happen and then the symptoms feel very bad. She denies drinking caffeinated beverages. He denies taking other medications. She denies family history of early heart problems. Her sister has hypertension and parent has diabetes but no other significant coronary artery disease that she knows of.  She reports that she also gets a tight knot in her trapezius on the left side. It makes a lump but pain does not radiate. Past Medical History:  Diagnosis Date  . Constipation   . Miscarriage   . No pertinent past medical history   . Ovarian cyst     Patient Active Problem List   Diagnosis Date Noted  . Ureteritis 01/11/2013  . Leukocytosis 01/11/2013  . Hypokalemia 01/11/2013    Past Surgical History:  Procedure Laterality Date  . NO PAST SURGERIES      OB History    Gravida Para Term Preterm AB Living   2 1 1     1    SAB TAB Ectopic Multiple Live Births                   Home Medications    Prior to Admission medications   Medication Sig Start Date End Date Taking? Authorizing Provider  metoprolol tartrate (LOPRESSOR) 25 MG tablet Take 0.5 tablets (12.5 mg total) by mouth 2 (two) times daily. 01/15/17   Arby BarretteMarcy Bond Grieshop, MD    Family History No family history on file.  Social  History Social History  Substance Use Topics  . Smoking status: Never Smoker  . Smokeless tobacco: Not on file  . Alcohol use No     Allergies   Patient has no known allergies.   Review of Systems Review of Systems  10 Systems reviewed and are negative for acute change except as noted in the HPI.  Physical Exam Updated Vital Signs BP 137/93   Pulse 100   Temp 98.3 F (36.8 C) (Oral)   Resp 19   Ht 5\' 2"  (1.575 m)   Wt 120 lb (54.4 kg)   SpO2 100%   BMI 21.95 kg/m   Physical Exam  Constitutional: She appears well-developed and well-nourished. No distress.  HENT:  Head: Normocephalic and atraumatic.  Nose: Nose normal.  Mouth/Throat: Oropharynx is clear and moist.  Eyes: Conjunctivae are normal.  Neck: Neck supple. No tracheal deviation present. No thyromegaly present.  Cardiovascular: Normal rate and regular rhythm.   No murmur heard. Pulmonary/Chest: Effort normal and breath sounds normal. No respiratory distress.  Abdominal: Soft. She exhibits no distension. There is no tenderness. There is no guarding.  Musculoskeletal: She exhibits no edema, tenderness or deformity.  Lymphadenopathy:    She has no cervical adenopathy.  Neurological: She is alert. She exhibits normal muscle tone. Coordination normal.  Skin: Skin  is warm and dry.  Psychiatric: She has a normal mood and affect.  Nursing note and vitals reviewed.    ED Treatments / Results  Labs (all labs ordered are listed, but only abnormal results are displayed) Labs Reviewed  BASIC METABOLIC PANEL - Abnormal; Notable for the following:       Result Value   Glucose, Bld 101 (*)    All other components within normal limits  CBC  TSH  D-DIMER, QUANTITATIVE (NOT AT [facility])  URINALYSIS, ROUTINE W REFLEX MICROSCOPIC  RAPID URINE DRUG SCREEN, HOSP PERFORMED  I-STAT BETA HCG BLOOD, ED ([facility], [facility], [facility] ONLY)    EKG  EKG Interpretation  Date/Time:  Friday January 14 2017 18:46:28 EST Ventricular Rate:   118 PR Interval:  134 QRS Duration: 84 QT Interval:  352 QTC Calculation: 493 R Axis:   89 Text Interpretation:  Sinus tachycardia no change from old. Confirmed by Donnald Garre, MD, Lebron Conners [phone]) on 01/14/2017 10:22:54 PM       Radiology No results found.  Procedures Procedures (including critical care time)  Medications Ordered in ED Medications  metoprolol tartrate (LOPRESSOR) tablet 12.5 mg (not administered)     Initial Impression / Assessment and Plan / ED Course  I have reviewed the triage vital signs and the nursing notes.  Pertinent labs & imaging results that were available during my care of the patient were reviewed by me and considered in my medical decision making (see chart for details).      Final Clinical Impressions(s) / ED Diagnoses   Final diagnoses:  Sinus tachycardia   At this time, all findings are within normal limits. During physical examination, heart rate might increase up to the 120s as the patient is discussing things that seem more anxiety provoking. Times as I'm sitting in there with her heart rate is down in the 80s to 90s. This is a sinus rhythm. Diagnostic evaluation is normal with normal TSH, negative d-dimer and no evidence of anemia or electrolyte anomaly. Patient is distressed by the symptoms of perceiving her heart racing. At this time, she will be empirically started on a low dose of daily metoprolol. Patient is counseled to follow-up with cardiology and contact information provided. Patient also incidentally described muscle spasm in her left trapezius. She is given Norflex for this. She is counseled on signs and symptoms for return. New Prescriptions New Prescriptions   METOPROLOL TARTRATE (LOPRESSOR) 25 MG TABLET    Take 0.5 tablets (12.5 mg total) by mouth 2 (two) times daily.     Arby Barrette, MD 01/15/17 [phone]

## 2017-01-18 ENCOUNTER — Encounter: Payer: Self-pay | Admitting: Cardiology

## 2017-01-18 ENCOUNTER — Ambulatory Visit (INDEPENDENT_AMBULATORY_CARE_PROVIDER_SITE_OTHER): Payer: Self-pay | Admitting: Cardiology

## 2017-01-18 VITALS — BP 117/78 | HR 98 | Ht 62.0 in | Wt 120.0 lb

## 2017-01-18 DIAGNOSIS — R002 Palpitations: Secondary | ICD-10-CM

## 2017-01-18 DIAGNOSIS — R072 Precordial pain: Secondary | ICD-10-CM

## 2017-01-18 NOTE — Patient Instructions (Signed)
Medication Instructions:   NO CHANGE  Testing/Procedures:  Your physician has requested that you have an echocardiogram. Echocardiography is a painless test that uses sound waves to create images of your heart. It provides your doctor with information about the size and shape of your heart and how well your heart's chambers and valves are working. This procedure takes approximately one hour. There are no restrictions for this procedure.   Your physician has recommended that you wear an event monitor. Event monitors are medical devices that record the heart's electrical activity. Doctors most often us these monitors to diagnose arrhythmias. Arrhythmias are problems with the speed or rhythm of the heartbeat. The monitor is a small, portable device. You can wear one while you do your normal daily activities. This is usually used to diagnose what is causing palpitations/syncope (passing out).    Follow-Up:  Your physician recommends that you schedule a follow-up appointment in: 8 WEEKS WITH DR CRENSHAW      

## 2017-02-08 ENCOUNTER — Ambulatory Visit (HOSPITAL_COMMUNITY): Payer: Self-pay | Attending: Cardiovascular Disease

## 2017-02-08 ENCOUNTER — Ambulatory Visit (INDEPENDENT_AMBULATORY_CARE_PROVIDER_SITE_OTHER): Payer: Self-pay

## 2017-02-08 ENCOUNTER — Other Ambulatory Visit: Payer: Self-pay

## 2017-02-08 DIAGNOSIS — R002 Palpitations: Secondary | ICD-10-CM | POA: Insufficient documentation

## 2017-02-08 DIAGNOSIS — I071 Rheumatic tricuspid insufficiency: Secondary | ICD-10-CM | POA: Insufficient documentation

## 2017-02-08 NOTE — Progress Notes (Unsigned)
Devlynn Domingues Lonna CobbRomero presented for her echocardiogram today [facility] [employer] [facility].   Dewitt HoesBilly Yair Dusza, RDCS

## 2017-02-09 ENCOUNTER — Other Ambulatory Visit: Payer: Self-pay | Admitting: *Deleted

## 2017-02-09 MED ORDER — METOPROLOL TARTRATE 25 MG PO TABS: 12.5000 mg | ORAL_TABLET | Freq: Two times a day (BID) | ORAL | 12 refills | Status: DC

## 2017-03-02 NOTE — Progress Notes (Signed)
      HPI: FU palpitations. Patient does not speak [place] and history is obtained with the assistance of an interpreter. Seen in the emergency room December 31 and Jan 26 with palpitations. Troponin and d-dimer normal. Chest x-ray negative. Potassium 3.3 and hemoglobin 14.6. TSH January 2018 1.215. K 3.5. Echocardiogram 02/08/2017 showed normal LV function. Monitor February 2018 showed no significant arrhythmia. Since last seen, patient has some dyspnea that is not clearly exertional. Her palpitations have improved but she did state she had some with a monitor in place. She has not had syncope.  Current Outpatient Prescriptions  Medication Sig Dispense Refill  . metoprolol tartrate (LOPRESSOR) 25 MG tablet Take 0.5 tablets (12.5 mg total) by mouth 2 (two) times daily. 30 tablet 12  . orphenadrine (NORFLEX) 100 MG tablet Take 1 tablet (100 mg total) by mouth 2 (two) times daily. 30 tablet 0   No current facility-administered medications for this visit.      Past Medical History:  Diagnosis Date  . Constipation   . Miscarriage   . No pertinent past medical history   . Ovarian cyst     Past Surgical History:  Procedure Laterality Date  . NO PAST SURGERIES      Social History   Social History  . Marital status: Married    Spouse name: N/A  . Number of children: 1  . Years of education: N/A   Occupational History  . Not on file.   Social History Main Topics  . Smoking status: Never Smoker  . Smokeless tobacco: Never Used  . Alcohol use Yes     Comment: Occasional  . Drug use: No  . Sexual activity: Yes    Birth control/ protection: Pill   Other Topics Concern  . Not on file   Social History Narrative  . No narrative on file    Family History  Problem Relation Age of Onset  . Family history unknown: Yes    ROS: no fevers or chills, productive cough, hemoptysis, dysphasia, odynophagia, melena, hematochezia, dysuria, hematuria, rash, seizure activity, orthopnea,  PND, pedal edema, claudication. Remaining systems are negative.  Physical Exam: Well-developed well-nourished in no acute distress.  Skin is warm and dry.  HEENT is normal.  Neck is supple. No bruits Chest is clear to auscultation with normal expansion.  Cardiovascular exam is regular rate and rhythm.  Abdominal exam nontender or distended. No masses palpated. Extremities show no edema. neuro grossly intact  A/P  1 palpitations-monitor showed no significant arrhythmias and patient did have some symptoms with device in place. LV function normal.We will not pursue further cardiac evaluation. I have told her she could discontinue her metoprolol.  2 chest pain-no recurrent symptoms. LV function normal. No further evaluation.     Olga MillersBrian Crenshaw, MD

## 2017-03-15 ENCOUNTER — Encounter: Payer: Self-pay | Admitting: Cardiology

## 2017-03-15 ENCOUNTER — Ambulatory Visit (INDEPENDENT_AMBULATORY_CARE_PROVIDER_SITE_OTHER): Payer: Self-pay | Admitting: Cardiology

## 2017-03-15 VITALS — BP 114/74 | HR 76 | Ht 62.0 in | Wt 117.0 lb

## 2017-03-15 DIAGNOSIS — R002 Palpitations: Secondary | ICD-10-CM

## 2017-03-15 MED ORDER — METOPROLOL TARTRATE 25 MG PO TABS: 12.5000 mg | ORAL_TABLET | Freq: Two times a day (BID) | ORAL | 9 refills | Status: DC

## 2017-03-15 NOTE — Patient Instructions (Signed)
Medication Instructions:   NO CHANGE  Follow-Up:  Your physician recommends that you schedule a follow-up appointment in: AS NEEDED  REFERRAL TO [facility]

## 2017-03-24 ENCOUNTER — Encounter (INDEPENDENT_AMBULATORY_CARE_PROVIDER_SITE_OTHER): Payer: Self-pay | Admitting: Physician Assistant

## 2017-03-24 ENCOUNTER — Ambulatory Visit (INDEPENDENT_AMBULATORY_CARE_PROVIDER_SITE_OTHER): Payer: Self-pay | Admitting: Physician Assistant

## 2017-03-24 VITALS — BP 99/66 | HR 79 | Temp 98.1°F | Ht 59.45 in | Wt 118.8 lb

## 2017-03-24 DIAGNOSIS — Z23 Encounter for immunization: Secondary | ICD-10-CM

## 2017-03-24 DIAGNOSIS — R8290 Unspecified abnormal findings in urine: Secondary | ICD-10-CM

## 2017-03-24 DIAGNOSIS — R002 Palpitations: Secondary | ICD-10-CM

## 2017-03-24 LAB — POCT URINALYSIS DIPSTICK
Bilirubin, UA: NEGATIVE
Blood, UA: NEGATIVE
Glucose, UA: NEGATIVE
KETONES UA: NEGATIVE
LEUKOCYTES UA: NEGATIVE
NITRITE UA: NEGATIVE
PH UA: 7.5 (ref 5.0–8.0)
PROTEIN UA: NEGATIVE
Spec Grav, UA: 1.015 (ref 1.030–1.035)
UROBILINOGEN UA: 0.2 (ref ?–2.0)

## 2017-03-24 LAB — POCT URINE PREGNANCY: PREG TEST UR: NEGATIVE

## 2017-03-24 MED ORDER — SERTRALINE HCL 50 MG PO TABS: 50.0000 mg | ORAL_TABLET | Freq: Every day | ORAL | 3 refills | Status: DC

## 2017-03-24 NOTE — Progress Notes (Signed)
Subjective:  Patient ID: Kristen Weaver, female    DOB: [birth year]  Age: 21 y.o. MRN: [id number]  CC: f/u palpitations  HPI Kristen Weaver is a 21 y.o. female with no significant PMH presents to f/u on palpitations. She was seen by cardiology after having been referred by the emergency department. Cardiology performed work up concluded the following:    1 palpitations-monitor showed no significant arrhythmias and patient did have some symptoms with device in place. LV function normal.We will not pursue further cardiac evaluation. I have told her she could discontinue her metoprolol.  2 chest pain-no recurrent symptoms. LV function normal. No further evaluation.  Patient has not felt palpitations since seeing cardiologist. Overall she feels healthy and remains active with her 32 year old son. Feels as though she may have a few stressors but does not believe stress/anxiety may be causing palpitations. Endorses generalized bone pain. Denies any other symptoms.     Review of Systems  Constitutional: Negative for chills, fever and malaise/fatigue.  Eyes: Negative for blurred vision.  Respiratory: Negative for shortness of breath.   Cardiovascular: Negative for chest pain and palpitations (no palpitations since cardiology work up).  Gastrointestinal: Negative for abdominal pain and nausea.  Genitourinary: Negative for dysuria and hematuria.  Musculoskeletal: Negative for joint pain and myalgias.  Skin: Negative for rash.  Neurological: Negative for tingling and headaches.  Psychiatric/Behavioral: Negative for depression. The patient is not nervous/anxious.     Objective:  BP 99/66 (BP Location: Left Arm, Patient Position: Sitting, Cuff Size: Normal)   Pulse 79   Temp 98.1 F (36.7 C) (Oral)   Ht 4' 11.45" (1.51 m)   Wt 118 lb 12.8 oz (53.9 kg)   LMP 03/10/2017 (Exact Date)   SpO2 97%   BMI 23.63 kg/m   BP/Weight 03/24/2017 03/15/2017 01/18/2017  Systolic BP 99  114 117  Diastolic BP 66 74 78  Wt. (Lbs) 118.8 117 120  BMI 23.63 21.4 21.95      Physical Exam  Constitutional: She is oriented to person, place, and time.  Well developed, well nourished, NAD, polite  HENT:  Head: Normocephalic and atraumatic.  Eyes: No scleral icterus.  Neck: Normal range of motion. Neck supple. No thyromegaly present.  Cardiovascular: Normal rate, regular rhythm and normal heart sounds.   Pulmonary/Chest: Effort normal and breath sounds normal.  Musculoskeletal: She exhibits no edema.  Neurological: She is alert and oriented to person, place, and time.  Skin: Skin is warm and dry. No rash noted. No erythema. No pallor.  Psychiatric: She has a normal mood and affect. Her behavior is normal. Thought content normal.  Vitals reviewed.    Assessment & Plan:   1. Palpitations - Cleared by cardiology - TSH and CBC normal - POCT urine pregnancy negative - GAD 7 score of 9. Rx'ed Sertraline. Will follow up in 4 weeks. - Hepatic Function Panel to assess ALP as Paget's disease is a possible cause of palpitations.  2. Foul smelling urine - POCT urinalysis dipstick negative  3. Need for tetanus booster - Tdap vaccine greater than or equal to 7yo IM     Meds ordered this encounter  Medications  . sertraline (ZOLOFT) 50 MG tablet    Sig: Take 1 tablet (50 mg total) by mouth daily.    Dispense:  30 tablet    Refill:  3    Order Specific Question:   Supervising Provider    Answer:   Quentin Angst [id number]  Follow-up: Return in about 4 weeks (around 04/21/2017) for Anxiety.   Loletta Specter PA

## 2017-03-24 NOTE — Progress Notes (Signed)
New patient  Pt went to cardiologist last week due to accelerated heart rates, cardiologist informed patient that heart was normal and to F/U with PCP PT is only taking fish oil supplements

## 2017-03-24 NOTE — Patient Instructions (Signed)
   Trastorno de ansiedad generalizada (Generalized Anxiety Disorder) El trastorno de ansiedad generalizada es un trastorno mental. Interfiere en las funciones vitales, incluyendo las relaciones, el trabajo y la escuela.  Es diferente de la ansiedad normal que todas las personas experimentan en algn momento de su vida en respuesta a sucesos y actividades especficas. En verdad, la ansiedad normal nos ayuda a prepararnos y atravesar estos acontecimientos y actividades de la vida. La ansiedad normal desaparece despus de que el evento o la actividad ha finalizado.  El trastorno de ansiedad generalizada no est necesariamente relacionada con eventos o actividades especficas. Tambin causa un exceso de ansiedad en proporcin a sucesos o actividades especficas. En este trastorno la ansiedad es difcil de controlar. Los sntomas pueden variar de leves a muy graves. Las personas que sufren de trastorno de ansiedad generalizada pueden tener intensas olas de ansiedad con sntomas fsicos (ataques de pnico).  SNTOMAS  La ansiedad y la preocupacin asociada a este trastorno son difciles de controlar. Esta ansiedad y la preocupacin estn relacionados con muchos eventos de la vida y sus actividades y tambin ocurre durante ms das de los que no ocurre, durante 6 meses o ms. Las personas que la sufren pueden tener tres o ms de los siguientes sntomas (uno o ms en los nios):   Agitacin   Fatiga.  Dificultades de concentracin.   Irritabilidad.  Tensin muscular  Dificultad para dormirse o sueo poco satisfactorio. DIAGNSTICO  Se diagnostica a travs de una evaluacin realizada por el mdico. El mdico le har preguntas acerca de su estado de nimo, sntomas fsicos y sucesos de su vida. Le har preguntas sobre su historia clnica, el consumo de alcohol o drogas, incluyendo los medicamentos recetados. Tambin le har un examen fsico e indicar anlisis de sangre. Ciertas enfermedades y el uso de  determinadas sustancias pueden causar sntomas similares a este trastorno. Su mdico lo puede derivar a un especialista en salud mental para una evaluacin ms profunda..  TRATAMIENTO  Las terapias siguientes se utilizan en el tratamiento de este trastorno:   Medicamentos - Se recetan antidepresivos para el control diario a largo plazo. Pueden indicarse tambin medicamentos para combatir la ansiedad en los casos graves, especialmente cuando ocurren ataques de pnico.   Terapia conversada (psicoterapia) Ciertos tipos de psicoterapia pueden ser tiles en el tratamiento del trastorno de ansiedad generalizada, proporcionando apoyo, educacin y orientacin. Una forma de psicoterapia llamada terapia cognitivo-conductual puede ensearle formas saludables de pensar y reaccionar a los eventos y actividades de la vida diaria.  Tcnicasde manejo del estrs- Estas tcnicas incluyen el yoga, la meditacin y el ejercicio y pueden ser muy tiles cuando se practican con regularidad. Un especialista en salud mental puede ayudar a determinar qu tratamiento es mejor para usted. Algunas personas obtienen mejora con una terapia. Sin embargo, otras personas requieren una combinacin de terapias.  Esta informacin no tiene como fin reemplazar el consejo del mdico. Asegrese de hacerle al mdico cualquier pregunta que tenga. Document Released: 04/02/2013 Document Revised: 12/27/2014 Elsevier Interactive Patient Education  2017 Elsevier Inc.  

## 2017-03-25 LAB — HEPATIC FUNCTION PANEL
ALBUMIN: 4.4 g/dL (ref 3.5–5.5)
ALK PHOS: 78 IU/L (ref 39–117)
ALT: 32 IU/L (ref 0–32)
AST: 17 IU/L (ref 0–40)
BILIRUBIN, DIRECT: 0.08 mg/dL (ref 0.00–0.40)
Bilirubin Total: <0.2 mg/dL (ref 0.0–1.2)
TOTAL PROTEIN: 8.1 g/dL (ref 6.0–8.5)

## 2017-04-21 ENCOUNTER — Encounter (INDEPENDENT_AMBULATORY_CARE_PROVIDER_SITE_OTHER): Payer: Self-pay | Admitting: Physician Assistant

## 2017-04-21 ENCOUNTER — Ambulatory Visit (INDEPENDENT_AMBULATORY_CARE_PROVIDER_SITE_OTHER): Payer: Self-pay | Admitting: Physician Assistant

## 2017-04-21 VITALS — BP 107/70 | HR 70 | Temp 98.0°F | Wt 114.0 lb

## 2017-04-21 DIAGNOSIS — F418 Other specified anxiety disorders: Secondary | ICD-10-CM

## 2017-04-21 DIAGNOSIS — B354 Tinea corporis: Secondary | ICD-10-CM

## 2017-04-21 DIAGNOSIS — R002 Palpitations: Secondary | ICD-10-CM

## 2017-04-21 MED ORDER — SERTRALINE HCL 50 MG PO TABS: 25.0000 mg | ORAL_TABLET | Freq: Every day | ORAL | 3 refills | Status: AC

## 2017-04-21 MED ORDER — KETOCONAZOLE 2 % EX CREA: 1.0000 "application " | TOPICAL_CREAM | Freq: Every day | CUTANEOUS | 0 refills | Status: AC

## 2017-04-21 NOTE — Patient Instructions (Signed)
Palpitaciones (Palpitations) Es la sensacin de sentir que el latido cardaco es irregular o es ms rpido que lo normal. Puede sentir como un aleteo o que falta un latido. Generalmente no es un problema grave. Las causas de las palpitaciones pueden ser diversas, entre ellas, el estrs y consumo de cigarrillos, cafena, alcohol y determinados medicamentos. Si bien la Harley-Davidsonmayora de las causas de las palpitaciones no son graves, estas pueden ser un signo de un problema mdico grave. En algunos casos, podra ser necesario hacer ms estudios. INSTRUCCIONES PARA EL CUIDADO EN EL HOGAR Est atento a cualquier cambio en los sntomas. Tome estas medidas para controlar la afeccin:  Evite consumir lo siguiente:  Advice workerBebidas que contengan cafena como el caf, el t, los refrescos, las pastillas para Geophysical data processoradelgazar y las bebidas energizantes.  Chocolate.  Alcohol.  No consuma ningn producto que contenga tabaco, lo que incluye cigarrillos, tabaco de Theatre managermascar y Administrator, Civil Servicecigarrillos electrnicos. Si necesita ayuda para dejar de fumar, consulte al American Expressmdico.  Trate de reducir los niveles de estrs y [place]ansiedad. Las siguientes actividades pueden ayudarlo a relajarse:  Education administratorracticar yoga.  Meditacin.  Actividad fsica como natacin, trote o caminatas.  Biorretroalimentacin. Este es un mtodo que le ensea a usar la mente para Chief Operating Officercontrolar cosas del cuerpo, como los latidos del corazn.  Descanse y duerma lo suficiente.  Tome los medicamentos de venta libre y los recetados solamente como se lo haya indicado el mdico.  OceanographerConcurra a todas las visitas de control como se lo haya indicado el mdico. Esto es importante. SOLICITE ATENCIN MDICA SI:  Contina con latidos cardacos rpidos o irregulares despus de 24 horas.  Las Smith Internationalpalpitaciones le suceden con ms frecuencia. SOLICITE ATENCIN MDICA DE INMEDIATO SI:  Siente falta de aire o dolor en el pecho.  Tiene un dolor de cabeza intenso.  Se siente mareado o se desmaya. Esta  informacin no tiene Theme park managercomo fin reemplazar el consejo del mdico. Asegrese de hacerle al mdico cualquier pregunta que tenga. Document Released: 09/15/2005 Document Revised: 03/29/2016 Document Reviewed: 08/21/2015 Elsevier Interactive Patient Education  2017 [employer]

## 2017-04-21 NOTE — Progress Notes (Signed)
Subjective:  Patient ID: Kristen Weaver, female    DOB: [birth year]  Age: 21 y.o. MRN: [id number]  CC: anxiety f/u  HPI Kristen Weaver is a 21 y.o. female with a PMH of palpitations, depression, and anxiety presents for these conditions. Pt stopped drinking caffeine and started eating healthier foods. Feels her diet adjustment has eliminated palpitations. In regards to depression and anxiety, says Sertraline has been beneficial and is interested in reducing dose because Sertraline makes her sleepy. Only stress is in trying to find a job that would provide a good work life balance. Lastly, she would like evaluation of skin lesions. Lesions are pruritic and located on abdomen and one small lesion on right forearm. Denies any other complaints.     Outpatient Medications Prior to Visit  Medication Sig Dispense Refill  . sertraline (ZOLOFT) 50 MG tablet Take 1 tablet (50 mg total) by mouth daily. 30 tablet 3   No facility-administered medications prior to visit.      ROS Review of Systems  Constitutional: Negative for chills, fever and malaise/fatigue.  Eyes: Negative for blurred vision.  Respiratory: Negative for shortness of breath.   Cardiovascular: Negative for chest pain and palpitations.  Gastrointestinal: Negative for abdominal pain and nausea.  Genitourinary: Negative for dysuria and hematuria.  Musculoskeletal: Negative for joint pain and myalgias.  Skin: Positive for rash.  Neurological: Negative for tingling and headaches.  Psychiatric/Behavioral: Negative for depression. The patient is not nervous/anxious.     Objective:  BP 107/70 (BP Location: Left Arm, Patient Position: Sitting, Cuff Size: Normal)   Pulse 70   Temp 98 F (36.7 C) (Oral)   Wt 114 lb (51.7 kg)   LMP 04/10/2017 (Exact Date)   SpO2 96%   BMI 22.68 kg/m   BP/Weight 04/21/2017 03/24/2017 03/15/2017  Systolic BP 107 99 114  Diastolic BP 70 66 74  Wt. (Lbs) 114 118.8 117  BMI 22.68  23.63 21.4      Physical Exam  Constitutional: She is oriented to person, place, and time.  Well developed, well nourished, NAD, polite  HENT:  Head: Normocephalic and atraumatic.  Eyes: No scleral icterus.  Neck: Normal range of motion. Neck supple. No thyromegaly present.  Cardiovascular: Normal rate, regular rhythm and normal heart sounds.   Pulmonary/Chest: Effort normal and breath sounds normal.  Musculoskeletal: She exhibits no edema.  Neurological: She is alert and oriented to person, place, and time.  Skin: Skin is warm and dry.  Small erythematous patch of irregularly shaped and finely scaled lesions on RUQ and LUQ of abdomen. A macular lesion with similar scaling on right forearm.  Psychiatric: She has a normal mood and affect. Her behavior is normal. Thought content normal.  Vitals reviewed.    Assessment & Plan:   1. Depression with anxiety - Begin down titration of Sertraline. Pt advised on how to down titrate.  2. Palpitations - Resolved. May have been a combination of reduced anxiety and elimination of caffeine.   3. Tinea corporis - Begin ketoconazole (NIZORAL) 2 % cream; Apply 1 application topically daily.  Dispense: 15 g; Refill: 0   Meds ordered this encounter  Medications  . ketoconazole (NIZORAL) 2 % cream    Sig: Apply 1 application topically daily.    Dispense:  15 g    Refill:  0    Order Specific Question:   Supervising Provider    Answer:   Quentin Angst [id number]  . sertraline (ZOLOFT) 50 MG tablet  Sig: Take 0.5 tablets (25 mg total) by mouth daily.    Dispense:  30 tablet    Refill:  3    Order Specific Question:   Supervising Provider    Answer:   Quentin AngstJEGEDE, OLUGBEMIGA E [id number]    Follow-up: Return if symptoms worsen or fail to improve.   Loletta Specteroger David Gomez PA

## 2017-05-06 ENCOUNTER — Ambulatory Visit (INDEPENDENT_AMBULATORY_CARE_PROVIDER_SITE_OTHER): Payer: Self-pay

## 2017-07-13 IMAGING — DX DG CHEST 2V
2 series · 2 of 2 positions shown · non-contrast
Comparison: 06/23/2015

CLINICAL DATA: Fast heart for 1 week intermittent, left-sided chest
pain

EXAM:
CHEST  2 VIEW

[chest pa]
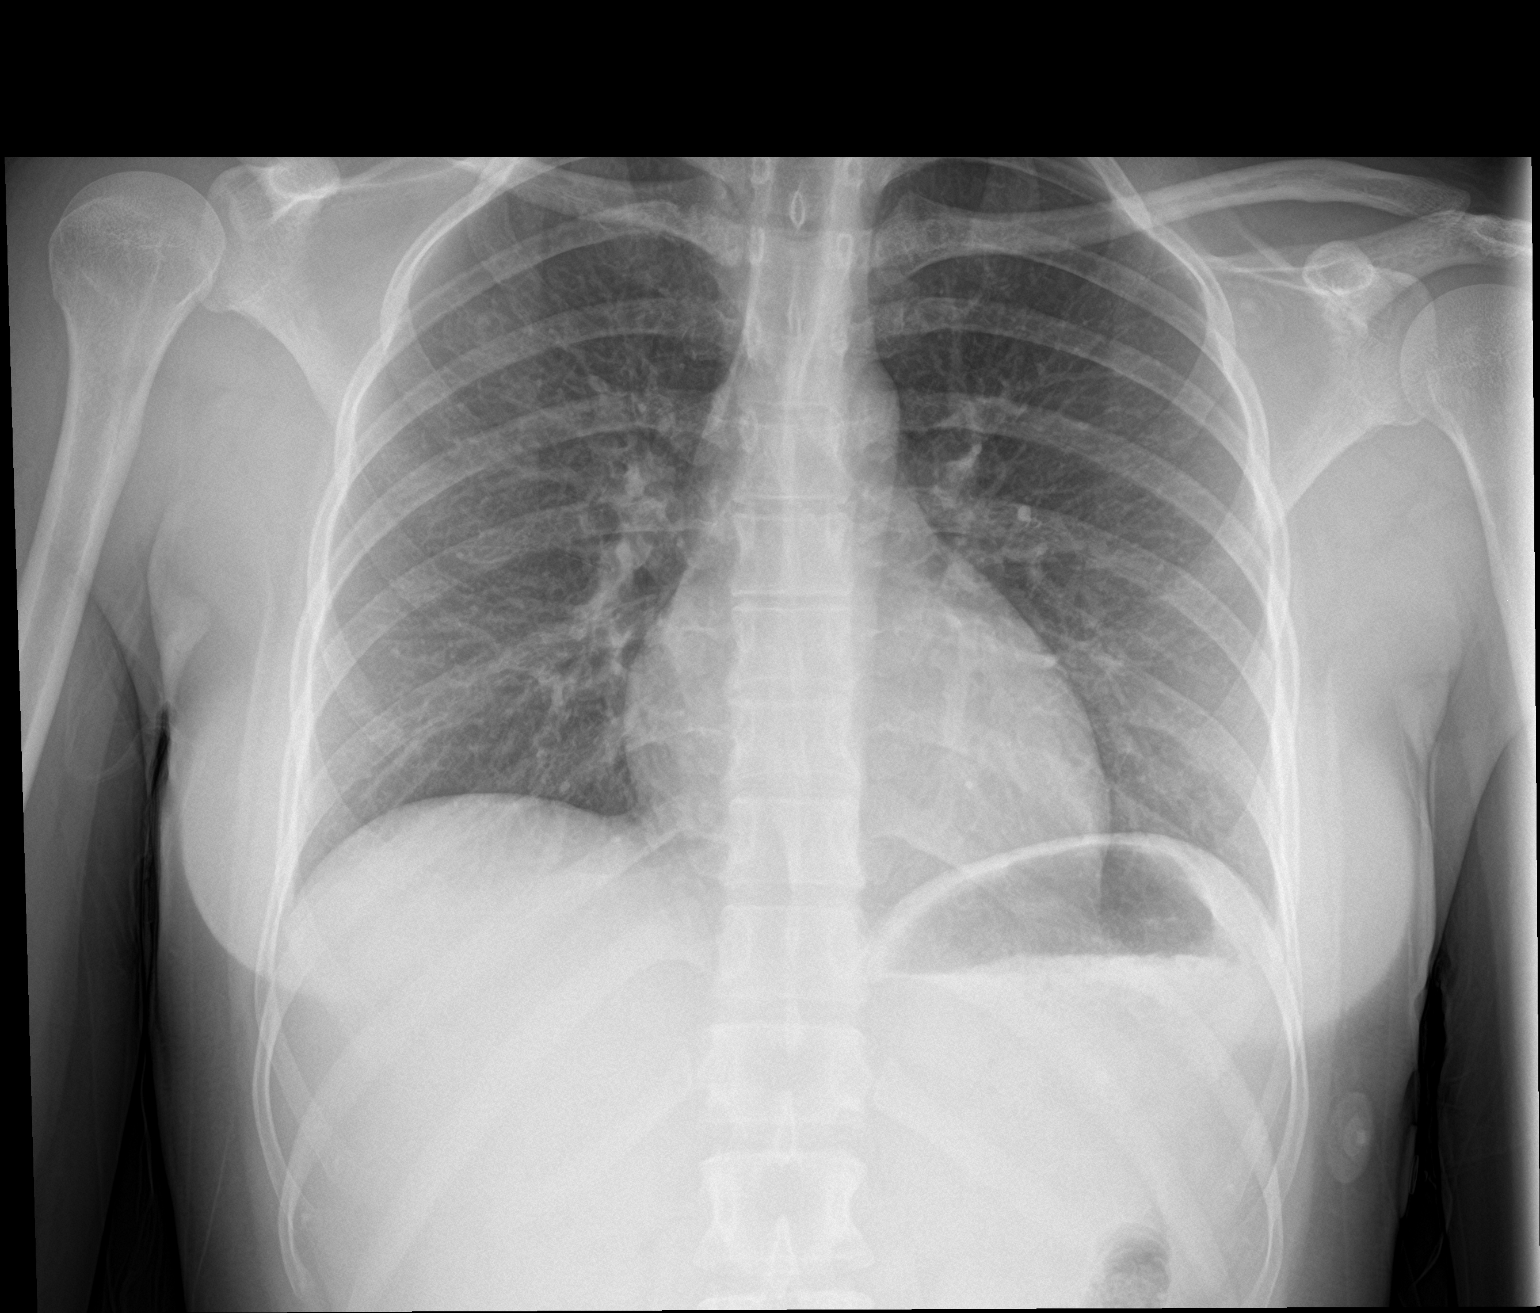

[chest lat]
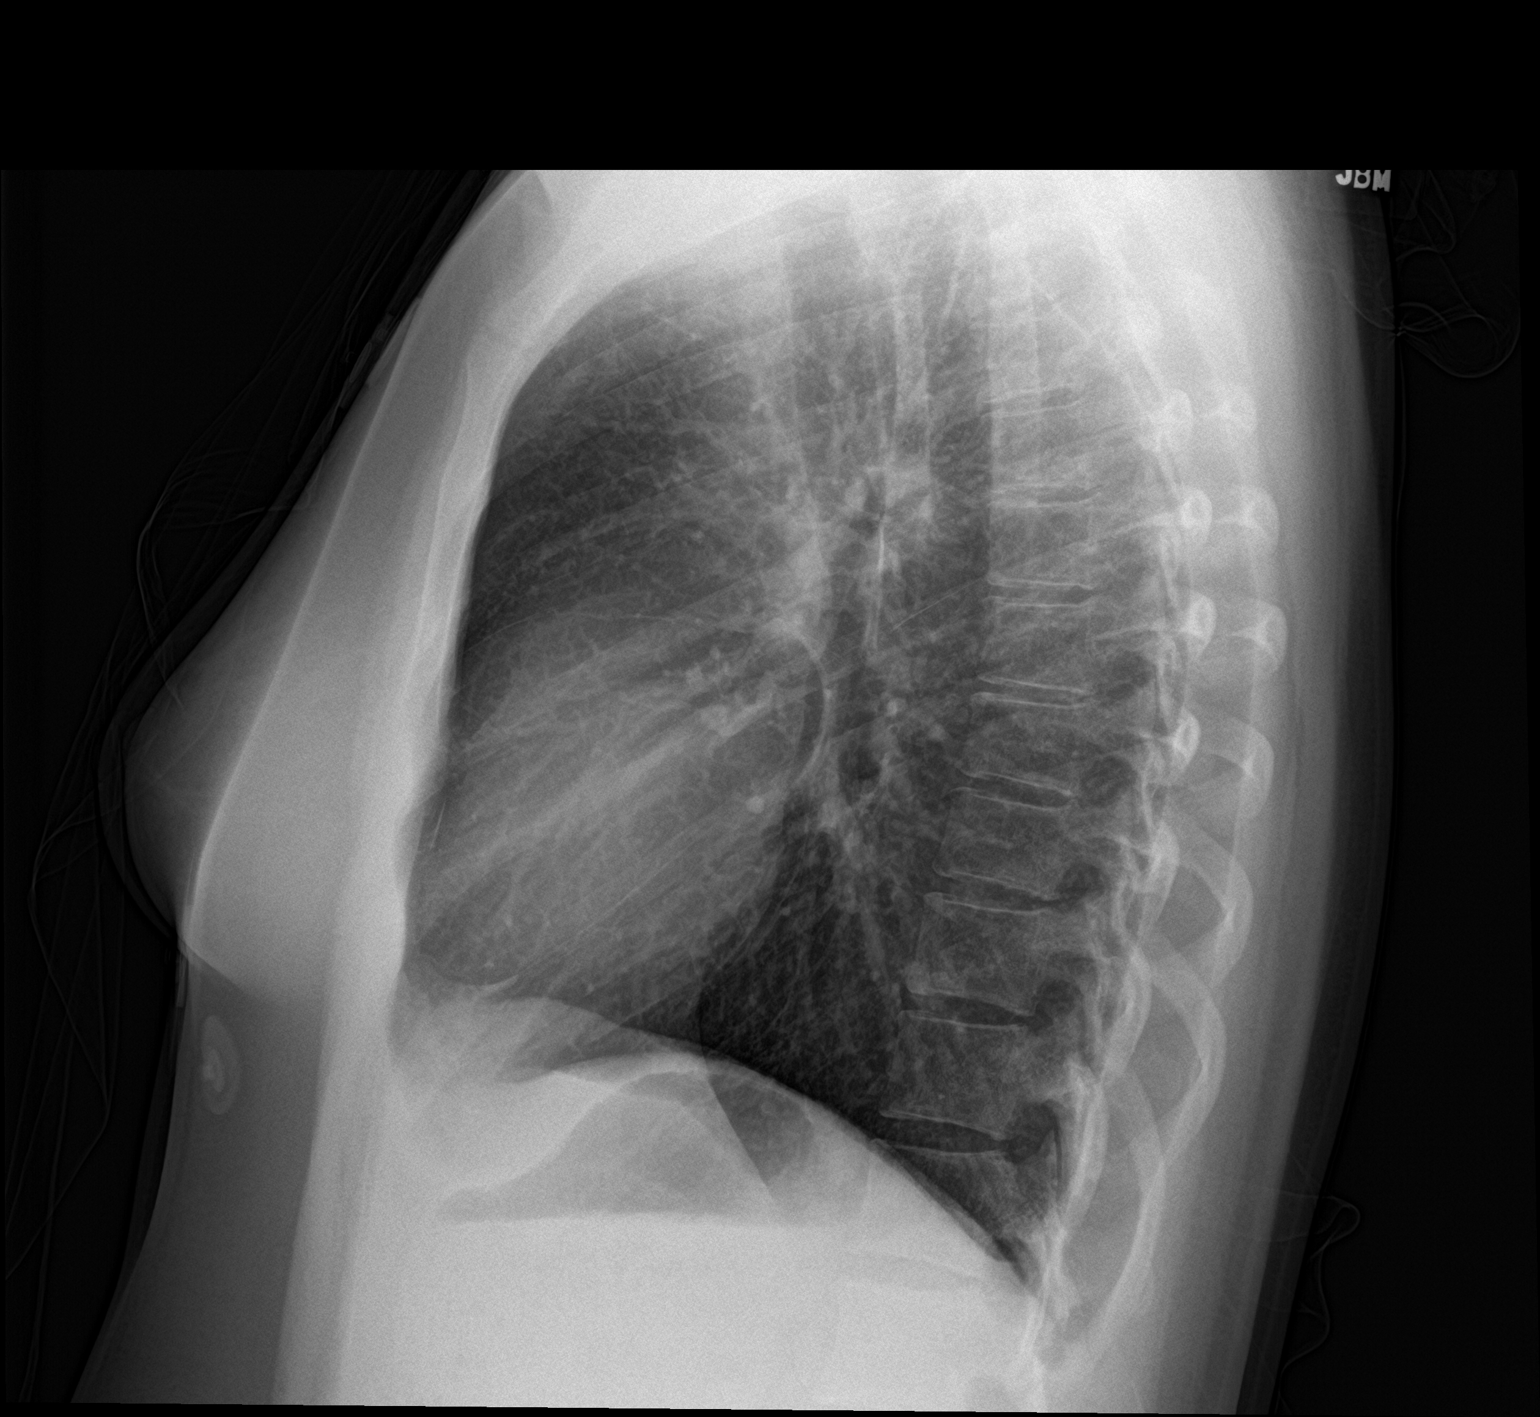

[2 of 2 positions shown; findings below may reference images not displayed]

FINDINGS: The heart size and mediastinal contours are within normal limits.
Both lungs are clear. The visualized skeletal structures are
unremarkable.
IMPRESSION: No active cardiopulmonary disease.

## 2017-08-03 ENCOUNTER — Ambulatory Visit (INDEPENDENT_AMBULATORY_CARE_PROVIDER_SITE_OTHER): Payer: Self-pay | Admitting: Physician Assistant

## 2020-04-09 ENCOUNTER — Ambulatory Visit: Payer: MEDICAID | Attending: Internal Medicine

## 2020-04-09 DIAGNOSIS — Z20822 Contact with and (suspected) exposure to covid-19: Secondary | ICD-10-CM

## 2020-04-10 LAB — SARS-COV-2, NAA 2 DAY TAT

## 2020-04-10 LAB — NOVEL CORONAVIRUS, NAA: SARS-CoV-2, NAA: NOT DETECTED

## 2020-04-11 ENCOUNTER — Telehealth: Payer: Self-pay | Admitting: *Deleted

## 2020-04-11 NOTE — Telephone Encounter (Signed)
Patient calling for COVID test result- notified negative. [facility] contact number given for copy of result
